# Patient Record
Sex: Male | Born: 1980
Health system: Southern US, Community
[De-identification: ages and names within clinical notes are randomized; demographics above are authoritative.]

## PROBLEM LIST (undated history)

## (undated) DIAGNOSIS — M48061 Spinal stenosis, lumbar region without neurogenic claudication: Secondary | ICD-10-CM

## (undated) DIAGNOSIS — R519 Headache, unspecified: Secondary | ICD-10-CM

## (undated) DIAGNOSIS — N201 Calculus of ureter: Secondary | ICD-10-CM

## (undated) HISTORY — PX: KNEE SURGERY: SHX244

## (undated) HISTORY — PX: FACIAL COSMETIC SURGERY: SHX629

## (undated) HISTORY — PX: FINGER FRACTURE SURGERY: SHX638

---

## 2016-07-11 ENCOUNTER — Emergency Department (HOSPITAL_BASED_OUTPATIENT_CLINIC_OR_DEPARTMENT_OTHER): Payer: Self-pay

## 2016-07-11 ENCOUNTER — Emergency Department (HOSPITAL_BASED_OUTPATIENT_CLINIC_OR_DEPARTMENT_OTHER)
Admission: EM | Admit: 2016-07-11 | Discharge: 2016-07-11 | Disposition: A | Discharge status: Home / Self Care | Payer: Self-pay | Attending: Emergency Medicine | Admitting: Emergency Medicine

## 2016-07-11 ENCOUNTER — Encounter (HOSPITAL_BASED_OUTPATIENT_CLINIC_OR_DEPARTMENT_OTHER): Payer: Self-pay | Admitting: *Deleted

## 2016-07-11 DIAGNOSIS — Y92009 Unspecified place in unspecified non-institutional (private) residence as the place of occurrence of the external cause: Secondary | ICD-10-CM | POA: Insufficient documentation

## 2016-07-11 DIAGNOSIS — X500XXA Overexertion from strenuous movement or load, initial encounter: Secondary | ICD-10-CM | POA: Insufficient documentation

## 2016-07-11 DIAGNOSIS — S43005A Unspecified dislocation of left shoulder joint, initial encounter: Secondary | ICD-10-CM | POA: Insufficient documentation

## 2016-07-11 DIAGNOSIS — Y999 Unspecified external cause status: Secondary | ICD-10-CM | POA: Insufficient documentation

## 2016-07-11 DIAGNOSIS — Y9389 Activity, other specified: Secondary | ICD-10-CM | POA: Insufficient documentation

## 2016-07-11 MED ORDER — IBUPROFEN 800 MG PO TABS
800.0000 mg | ORAL_TABLET | Freq: Once | ORAL | Status: AC
  Administered 2016-07-11: 800 mg via ORAL
  Filled 2016-07-11: qty 1

## 2016-07-11 MED ORDER — MORPHINE SULFATE (PF) 4 MG/ML IV SOLN
4.0000 mg | Freq: Once | INTRAVENOUS | Status: AC
  Administered 2016-07-11: 4 mg via INTRAMUSCULAR
  Filled 2016-07-11: qty 1

## 2016-07-11 MED ORDER — HYDROCODONE-ACETAMINOPHEN 5-325 MG PO TABS: 1.0000 | ORAL_TABLET | ORAL | 0 refills | Status: DC | PRN

## 2016-07-11 MED ORDER — ONDANSETRON 4 MG PO TBDP
4.0000 mg | ORAL_TABLET | Freq: Once | ORAL | Status: AC
  Administered 2016-07-11: 4 mg via ORAL
  Filled 2016-07-11: qty 1

## 2016-07-11 MED ORDER — DEXAMETHASONE SODIUM PHOSPHATE 10 MG/ML IJ SOLN
10.0000 mg | Freq: Once | INTRAMUSCULAR | Status: AC
  Administered 2016-07-11: 10 mg via INTRAMUSCULAR
  Filled 2016-07-11: qty 1

## 2016-07-11 MED ORDER — IBUPROFEN 800 MG PO TABS: 800.0000 mg | ORAL_TABLET | Freq: Three times a day (TID) | ORAL | 0 refills | Status: DC

## 2016-07-11 MED ORDER — HYDROCODONE-ACETAMINOPHEN 5-325 MG PO TABS
1.0000 | ORAL_TABLET | Freq: Once | ORAL | Status: AC
  Administered 2016-07-11: 1 via ORAL
  Filled 2016-07-11: qty 1

## 2016-07-11 NOTE — ED Triage Notes (Signed)
States he pick up a dresser ?300 lbs. C/o pain in left shoulder, neck and left hand is numb.

## 2016-07-11 NOTE — ED Provider Notes (Signed)
[facility] Provider Note   CSN: [id number] Arrival date & time: 07/11/16  0849     History   Chief Complaint Chief Complaint  Patient presents with  . Shoulder Injury    HPI Ricky Buckley is a 36 y.o. male.  Pt said that he picked up a dresser that weighed about 300 lbs and was carrying it out of a house.  He felt his shoulder "give out."  He said the lady that lived at the house was an Dance movement psychotherapistMS worker and manipulated his arm.  He felt a loud pop and his shoulder moved.  It sounds like it was dislocated, then reduced.  The pt still has pain in his left shoulder going down to his left elbow.  He has some numbness in his left hand.  He is able to move his left hand, but is unable to lift his left arm.      History reviewed. No pertinent past medical history.  There are no active problems to display for this patient.   History reviewed. No pertinent surgical history.     Home Medications    Prior to Admission medications   Medication Sig Start Date End Date Taking? Authorizing Provider  HYDROcodone-acetaminophen (NORCO/VICODIN) 5-325 MG tablet Take 1 tablet by mouth every 4 (four) hours as needed. 07/11/16   Jacalyn LefevreJulie Aboubacar Matsuo, MD  ibuprofen (ADVIL,MOTRIN) 800 MG tablet Take 1 tablet (800 mg total) by mouth 3 (three) times daily. 07/11/16   Jacalyn LefevreJulie Sharonne Ricketts, MD    Family History No family history on file.  Social History Social History  Substance Use Topics  . Smoking status: Never Smoker  . Smokeless tobacco: Never Used  . Alcohol use Not on file     Allergies   Penicillins   Review of Systems Review of Systems  Musculoskeletal:       Left shoulder pain  All other systems reviewed and are negative.    Physical Exam Updated Vital Signs BP 110/93 (BP Location: Right Arm)   Pulse 82   Temp 98.4 F (36.9 C) (Oral)   Resp 16   Ht 6\' 3"  (1.905 m)   Wt (!) 350 lb (158.8 kg)   SpO2 100%   BMI 43.75 kg/m   Physical Exam  Constitutional: He is  oriented to person, place, and time. He appears well-developed and well-nourished.  HENT:  Head: Normocephalic and atraumatic.  Right Ear: External ear normal.  Left Ear: External ear normal.  Nose: Nose normal.  Mouth/Throat: Oropharynx is clear and moist.  Eyes: Conjunctivae and EOM are normal. Pupils are equal, round, and reactive to light.  Neck: Normal range of motion. Neck supple.  Cardiovascular: Normal rate, regular rhythm, normal heart sounds and intact distal pulses.   Pulmonary/Chest: Effort normal and breath sounds normal.  Abdominal: Soft. Bowel sounds are normal.  Musculoskeletal:       Left shoulder: He exhibits decreased range of motion and tenderness.  Neurological: He is alert and oriented to person, place, and time.  Skin: Skin is warm.  Psychiatric: He has a normal mood and affect. His behavior is normal. Judgment and thought content normal.  Nursing note and vitals reviewed.    ED Treatments / Results  Labs (all labs ordered are listed, but only abnormal results are displayed) Labs Reviewed - No data to display  EKG  EKG Interpretation None       Radiology Dg Shoulder Left  Result Date: 07/11/2016 CLINICAL DATA:  Initial encounter for Picked up a  dresser approx 300 lbs today. C/o pain in LEFT shoulder, neck and left hand is numb. EXAM: LEFT SHOULDER - 2+ VIEW COMPARISON:  None. FINDINGS: Mild degenerative irregularity of the undersurface of the acromioclavicular joint. No acute fracture or dislocation. Visualized portion of the left hemithorax is normal. IMPRESSION: Degenerative change, without acute osseous finding. Electronically Signed   By: Jeronimo Greaves M.D.   On: 07/11/2016 09:44    Procedures Procedures (including critical care time)  Medications Ordered in ED Medications  HYDROcodone-acetaminophen (NORCO/VICODIN) 5-325 MG per tablet 1 tablet (not administered)  ibuprofen (ADVIL,MOTRIN) tablet 800 mg (not administered)  morphine 4 MG/ML  injection 4 mg (4 mg Intramuscular Given 07/11/16 0922)  dexamethasone (DECADRON) injection 10 mg (10 mg Intramuscular Given 07/11/16 0917)  ondansetron (ZOFRAN-ODT) disintegrating tablet 4 mg (4 mg Oral Given 07/11/16 0916)     Initial Impression / Assessment and Plan / ED Course  I have reviewed the triage vital signs and the nursing notes.  Pertinent labs & imaging results that were available during my care of the patient were reviewed by me and considered in my medical decision making (see chart for details).     Pt still has some pain, so will be given lortab and ibuprofen.  He is given a sling and the number for ortho to f/u.  He knows to return if worse.  Final Clinical Impressions(s) / ED Diagnoses   Final diagnoses:  Shoulder dislocation, left, initial encounter    New Prescriptions New Prescriptions   HYDROCODONE-ACETAMINOPHEN (NORCO/VICODIN) 5-325 MG TABLET    Take 1 tablet by mouth every 4 (four) hours as needed.   IBUPROFEN (ADVIL,MOTRIN) 800 MG TABLET    Take 1 tablet (800 mg total) by mouth 3 (three) times daily.     Jacalyn Lefevre, MD 07/11/16 [phone]

## 2016-07-15 ENCOUNTER — Encounter: Payer: Self-pay | Admitting: Family Medicine

## 2016-07-15 ENCOUNTER — Ambulatory Visit (INDEPENDENT_AMBULATORY_CARE_PROVIDER_SITE_OTHER): Payer: Self-pay | Admitting: Family Medicine

## 2016-07-15 DIAGNOSIS — S43015A Anterior dislocation of left humerus, initial encounter: Secondary | ICD-10-CM

## 2016-07-15 MED ORDER — OXYCODONE-ACETAMINOPHEN 7.5-325 MG PO TABS: 1.0000 | ORAL_TABLET | ORAL | 0 refills | Status: DC | PRN

## 2016-07-15 MED FILL — OXYCODONE-APAP 7.5-325 MG: 7.5-325 | 5 days supply | Qty: 30 | Fill #0

## 2016-07-15 NOTE — Patient Instructions (Addendum)
You dislocated your left shoulder. When tolerated do the home motion exercises (arm circles, swings, table slides) 3 sets of 10 twice a day. Ibuprofen 800mg  three times a day with food. Oxycodone as needed for severe pain. Get the [facility] coverage ASAP - this will help us take the next steps. Follow up with me in 1 1/2 weeks for reevaluation. Will consider physical therapy, MRI depending on how you're doing.

## 2016-07-16 DIAGNOSIS — S43015A Anterior dislocation of left humerus, initial encounter: Secondary | ICD-10-CM | POA: Insufficient documentation

## 2016-07-16 NOTE — Progress Notes (Signed)
PCP: No PCP Per Patient  Subjective:   HPI: Patient is a 36 y.o. male here for left shoulder injury.  Patient reports on 1/26 he was at work (first day on the job) lifting and carrying a 300 pound [employer]resser. While doing so he felt his left shoulder rip downwards and felt like his arm was 'just hanging there.' No prior injuries to this shoulder. A coworker reportedly had worked for EMS and relocated his shoulder. Pain level 9/10. He is having fairly severe sharp pain in this shoulder. Difficulty with any movement. Used a sling but too painful to use this. Is taking ibuprofen and liquid hydrocodone (makes him sleepy) from the ED. No skin changes, numbness, bruising.  No past medical history on file.  Current Outpatient Prescriptions on File Prior to Visit  Medication Sig Dispense Refill  . ibuprofen (ADVIL,MOTRIN) 800 MG tablet Take 1 tablet (800 mg total) by mouth 3 (three) times daily. 21 tablet 0   No current facility-administered medications on file prior to visit.     No past surgical history on file.  Allergies  Allergen Reactions  . Penicillins Shortness Of Breath    Social History   Social History  . Marital status: Single    Spouse name: N/A  . Number of children: N/A  . Years of education: N/A   Occupational History  . Not on file.   Social History Main Topics  . Smoking status: Never Smoker  . Smokeless tobacco: Never Used  . Alcohol use Not on file  . Drug use: Unknown  . Sexual activity: Not on file   Other Topics Concern  . Not on file   Social History Narrative  . No narrative on file    No family history on file.  BP (!) 139/93   Pulse (!) 54   Ht 6\' 3"  (1.905 m)   Wt (!) 354 lb (160.6 kg)   BMI 44.25 kg/m   Review of Systems: See HPI above.     Objective:  Physical Exam:  Gen: NAD, comfortable in exam room  Left shoulder: No swelling, ecchymoses.  No gross deformity.  Diffuse TTP anterior, posterior shoulder and scapula. Full  IR - only 20 degrees ER and 50 degrees flexion, all motions painful. Negative Yergasons. Did not test strength due to motion, pain limitations. NV intact distally.  Right shoulder: FROM Without pain.   Assessment & Plan:  1. Left shoulder dislocation - 5 days out from this, had relocated prior to visit to ED.  Independently reviewed radiographs and no evidence fracture, bony bankart lesion.  Feels worse in sling so will not use this.  Ibuprofen with oxycodone as needed for severe pain.  Shown codman exercises to help regain motion.  Get cone coverage so we can move forward with either PT, MRI depending on his improvement.  F/u in 1 1/2 weeks for reevaluation.

## 2016-07-16 NOTE — Assessment & Plan Note (Signed)
5 days out from this, had relocated prior to visit to ED.  Independently reviewed radiographs and no evidence fracture, bony bankart lesion.  Feels worse in sling so will not use this.  Ibuprofen with oxycodone as needed for severe pain.  Shown codman exercises to help regain motion.  Get cone coverage so we can move forward with either PT, MRI depending on his improvement.  F/u in 1 1/2 weeks for reevaluation.

## 2016-07-25 ENCOUNTER — Telehealth: Payer: Self-pay | Admitting: Family Medicine

## 2016-07-25 NOTE — Telephone Encounter (Signed)
Spoke to patient and he stated that he is working on the Delphicone coverage. Said that he has tried hydrocodone and it did not help.

## 2016-07-25 NOTE — Telephone Encounter (Signed)
Seeing him for an office visit is unlikely to help unless he reinjured himself.  Has he gotten the [facility] Coverage yet?  If he does we can go ahead with MRI as next step.  We could give him pain medicine as well though this is not going to fix anything (we could try hydrocodone instead to see if better results).

## 2016-07-25 NOTE — Telephone Encounter (Signed)
Made appointment for patient

## 2016-07-25 NOTE — Telephone Encounter (Signed)
Noted, thanks!

## 2016-07-26 ENCOUNTER — Encounter (HOSPITAL_BASED_OUTPATIENT_CLINIC_OR_DEPARTMENT_OTHER): Payer: Self-pay | Admitting: Emergency Medicine

## 2016-07-26 ENCOUNTER — Emergency Department (HOSPITAL_BASED_OUTPATIENT_CLINIC_OR_DEPARTMENT_OTHER): Payer: Self-pay

## 2016-07-26 ENCOUNTER — Emergency Department (HOSPITAL_BASED_OUTPATIENT_CLINIC_OR_DEPARTMENT_OTHER)
Admission: EM | Admit: 2016-07-26 | Discharge: 2016-07-26 | Disposition: A | Discharge status: Other Acute Inpatient Hospital | Payer: Self-pay | Attending: Urology | Admitting: Urology

## 2016-07-26 ENCOUNTER — Emergency Department (HOSPITAL_COMMUNITY): Payer: Self-pay | Admitting: Anesthesiology

## 2016-07-26 ENCOUNTER — Encounter (HOSPITAL_COMMUNITY)
Admission: EM | Disposition: A | Discharge status: Other Acute Inpatient Hospital | Payer: Self-pay | Source: Home / Self Care | Attending: Emergency Medicine

## 2016-07-26 DIAGNOSIS — Z87442 Personal history of urinary calculi: Secondary | ICD-10-CM | POA: Insufficient documentation

## 2016-07-26 DIAGNOSIS — N201 Calculus of ureter: Secondary | ICD-10-CM | POA: Insufficient documentation

## 2016-07-26 DIAGNOSIS — R52 Pain, unspecified: Secondary | ICD-10-CM

## 2016-07-26 HISTORY — DX: Calculus of ureter: N20.1

## 2016-07-26 HISTORY — PX: HOLMIUM LASER APPLICATION: SHX5852

## 2016-07-26 HISTORY — PX: CYSTOSCOPY/RETROGRADE/URETEROSCOPY/STONE EXTRACTION WITH BASKET: SHX5317

## 2016-07-26 SURGERY — CYSTOSCOPY, WITH CALCULUS REMOVAL USING BASKET
Anesthesia: General | Site: Ureter | Laterality: Left

## 2016-07-26 MED ORDER — FENTANYL CITRATE (PF) 100 MCG/2ML IJ SOLN
INTRAMUSCULAR | Status: AC
  Filled 2016-07-26: qty 2

## 2016-07-26 MED ORDER — SODIUM CHLORIDE 0.9 % IR SOLN
Status: DC | PRN
  Administered 2016-07-26: 1000 mL
  Administered 2016-07-26: 3000 mL
  Administered 2016-07-26: 1000 mL

## 2016-07-26 MED ORDER — KETOROLAC TROMETHAMINE 15 MG/ML IJ SOLN
15.0000 mg | Freq: Once | INTRAMUSCULAR | Status: AC
  Administered 2016-07-26: 15 mg via INTRAVENOUS
  Filled 2016-07-26: qty 1

## 2016-07-26 MED ORDER — HYDROMORPHONE HCL 1 MG/ML IJ SOLN
1.0000 mg | Freq: Once | INTRAMUSCULAR | Status: AC
  Administered 2016-07-26: 1 mg via INTRAVENOUS
  Filled 2016-07-26: qty 1

## 2016-07-26 MED ORDER — PROMETHAZINE HCL 25 MG/ML IJ SOLN: 6.2500 mg | INTRAMUSCULAR | Status: DC | PRN

## 2016-07-26 MED ORDER — CIPROFLOXACIN IN D5W 400 MG/200ML IV SOLN
400.0000 mg | Freq: Once | INTRAVENOUS | Status: AC
  Administered 2016-07-26: 400 mg via INTRAVENOUS
  Filled 2016-07-26: qty 200

## 2016-07-26 MED ORDER — ROCURONIUM BROMIDE 10 MG/ML (PF) SYRINGE
PREFILLED_SYRINGE | INTRAVENOUS | Status: DC | PRN
  Administered 2016-07-26: 30 mg via INTRAVENOUS

## 2016-07-26 MED ORDER — ALBUTEROL SULFATE HFA 108 (90 BASE) MCG/ACT IN AERS
INHALATION_SPRAY | RESPIRATORY_TRACT | Status: DC | PRN
Start: 2016-07-26 — End: 2016-07-26
  Administered 2016-07-26: 4 via RESPIRATORY_TRACT

## 2016-07-26 MED ORDER — PROPOFOL 10 MG/ML IV BOLUS
INTRAVENOUS | Status: AC
  Filled 2016-07-26: qty 20

## 2016-07-26 MED ORDER — HYDROCODONE-ACETAMINOPHEN 5-325 MG PO TABS
ORAL_TABLET | ORAL | Status: AC
  Filled 2016-07-26: qty 1

## 2016-07-26 MED ORDER — SODIUM CHLORIDE 0.9 % IV SOLN
Freq: Once | INTRAVENOUS | Status: AC
  Administered 2016-07-26: 04:00:00 via INTRAVENOUS

## 2016-07-26 MED ORDER — SULFAMETHOXAZOLE-TRIMETHOPRIM 800-160 MG PO TABS: 1.0000 | ORAL_TABLET | Freq: Two times a day (BID) | ORAL | 0 refills | Status: DC

## 2016-07-26 MED ORDER — PROPOFOL 10 MG/ML IV BOLUS
INTRAVENOUS | Status: DC | PRN
  Administered 2016-07-26: 200 mg via INTRAVENOUS
  Administered 2016-07-26: 50 mg via INTRAVENOUS

## 2016-07-26 MED ORDER — ONDANSETRON HCL 4 MG/2ML IJ SOLN
4.0000 mg | Freq: Once | INTRAMUSCULAR | Status: AC
  Administered 2016-07-26: 4 mg via INTRAVENOUS
  Filled 2016-07-26: qty 2

## 2016-07-26 MED ORDER — HYDROCODONE-ACETAMINOPHEN 5-325 MG PO TABS
1.0000 | ORAL_TABLET | Freq: Once | ORAL | Status: AC | PRN
  Administered 2016-07-26: 1 via ORAL

## 2016-07-26 MED ORDER — FENTANYL CITRATE (PF) 100 MCG/2ML IJ SOLN
25.0000 ug | INTRAMUSCULAR | Status: DC | PRN
Start: 2016-07-26 — End: 2016-07-26

## 2016-07-26 MED ORDER — SUCCINYLCHOLINE CHLORIDE 200 MG/10ML IV SOSY
PREFILLED_SYRINGE | INTRAVENOUS | Status: DC | PRN
  Administered 2016-07-26: 200 mg via INTRAVENOUS

## 2016-07-26 MED ORDER — SUGAMMADEX SODIUM 200 MG/2ML IV SOLN
INTRAVENOUS | Status: DC | PRN
  Administered 2016-07-26: 400 mg via INTRAVENOUS

## 2016-07-26 MED ORDER — PROMETHAZINE HCL 25 MG/ML IJ SOLN
INTRAMUSCULAR | Status: DC | PRN
  Administered 2016-07-26 (×2): 12.5 mg via INTRAVENOUS

## 2016-07-26 MED ORDER — DEXAMETHASONE SODIUM PHOSPHATE 10 MG/ML IJ SOLN
INTRAMUSCULAR | Status: DC | PRN
  Administered 2016-07-26: 10 mg via INTRAVENOUS

## 2016-07-26 MED ORDER — PROMETHAZINE HCL 25 MG/ML IJ SOLN
INTRAMUSCULAR | Status: AC
  Filled 2016-07-26: qty 1

## 2016-07-26 MED ORDER — LIDOCAINE 2% (20 MG/ML) 5 ML SYRINGE
INTRAMUSCULAR | Status: DC | PRN
  Administered 2016-07-26: 100 mg via INTRAVENOUS

## 2016-07-26 MED ORDER — OXYCODONE HCL 5 MG PO TABS: 5.0000 mg | ORAL_TABLET | ORAL | 0 refills | Status: DC | PRN

## 2016-07-26 MED ORDER — LACTATED RINGERS IV SOLN
INTRAVENOUS | Status: DC | PRN
  Administered 2016-07-26: 09:00:00 via INTRAVENOUS

## 2016-07-26 MED ORDER — OXYBUTYNIN CHLORIDE 5 MG PO TABS: 5.0000 mg | ORAL_TABLET | Freq: Three times a day (TID) | ORAL | 1 refills | Status: DC | PRN

## 2016-07-26 MED ORDER — 0.9 % SODIUM CHLORIDE (POUR BTL) OPTIME
TOPICAL | Status: DC | PRN
  Administered 2016-07-26: 1000 mL

## 2016-07-26 MED ORDER — CIPROFLOXACIN IN D5W 400 MG/200ML IV SOLN
INTRAVENOUS | Status: AC
  Filled 2016-07-26: qty 200

## 2016-07-26 MED ORDER — FENTANYL CITRATE (PF) 100 MCG/2ML IJ SOLN
INTRAMUSCULAR | Status: DC | PRN
  Administered 2016-07-26: 100 ug via INTRAVENOUS

## 2016-07-26 MED ORDER — SUGAMMADEX SODIUM 500 MG/5ML IV SOLN
INTRAVENOUS | Status: AC
  Filled 2016-07-26: qty 5

## 2016-07-26 MED ORDER — SODIUM CHLORIDE 0.9 % IV SOLN
INTRAVENOUS | Status: DC | PRN
  Administered 2016-07-26: 10 mL

## 2016-07-26 MED ORDER — MORPHINE SULFATE (PF) 4 MG/ML IV SOLN
4.0000 mg | Freq: Once | INTRAVENOUS | Status: AC
  Administered 2016-07-26: 4 mg via INTRAVENOUS
  Filled 2016-07-26: qty 1

## 2016-07-26 SURGICAL SUPPLY — 22 items
BAG URO CATCHER STRL LF (MISCELLANEOUS) ×3 IMPLANT
BASKET DAKOTA 1.9FR 11X120 (BASKET) ×6 IMPLANT
BASKET LASER NITINOL 1.9FR (BASKET) IMPLANT
BASKET ZERO TIP NITINOL 2.4FR (BASKET) IMPLANT
CATH INTERMIT  6FR 70CM (CATHETERS) ×3 IMPLANT
CLOTH BEACON ORANGE TIMEOUT ST (SAFETY) ×3 IMPLANT
FIBER LASER FLEXIVA 365 (UROLOGICAL SUPPLIES) IMPLANT
FIBER LASER TRAC TIP (UROLOGICAL SUPPLIES) ×3 IMPLANT
GLOVE BIOGEL M 8.0 STRL (GLOVE) ×3 IMPLANT
GOWN STRL REUS W/ TWL XL LVL3 (GOWN DISPOSABLE) ×1 IMPLANT
GOWN STRL REUS W/TWL LRG LVL3 (GOWN DISPOSABLE) ×6 IMPLANT
GOWN STRL REUS W/TWL XL LVL3 (GOWN DISPOSABLE) ×2
GUIDEWIRE ANG ZIPWIRE 038X150 (WIRE) ×3 IMPLANT
GUIDEWIRE STR DUAL SENSOR (WIRE) ×3 IMPLANT
IV NS 1000ML (IV SOLUTION) ×4
IV NS 1000ML BAXH (IV SOLUTION) ×2 IMPLANT
MANIFOLD NEPTUNE II (INSTRUMENTS) ×3 IMPLANT
PACK CYSTO (CUSTOM PROCEDURE TRAY) ×3 IMPLANT
SHEATH URET ACCESS 12FR/35CM (UROLOGICAL SUPPLIES) ×3 IMPLANT
STENT URET 6FRX26 CONTOUR (STENTS) ×3 IMPLANT
TUBING CONNECTING 10 (TUBING) ×2 IMPLANT
TUBING CONNECTING 10' (TUBING) ×1

## 2016-07-26 NOTE — Anesthesia Procedure Notes (Signed)
Procedure Name: Intubation Date/Time: 07/26/2016 9:44 AM Performed by: Leroy LibmanEARDON, Gabreil [place] L Patient Re-evaluated:Patient Re-evaluated prior to inductionOxygen Delivery Method: Circle system utilized Preoxygenation: Pre-oxygenation with 100% oxygen Intubation Type: IV induction and Rapid sequence Laryngoscope Size: Miller and 3 Grade View: Grade I Tube type: Oral Tube size: 8.0 mm Number of attempts: 1 Airway Equipment and Method: Stylet Placement Confirmation: ETT inserted through vocal cords under direct vision,  positive ETCO2 and breath sounds checked- equal and bilateral Secured at: 22 cm Tube secured with: Tape Dental Injury: Teeth and Oropharynx as per pre-operative assessment

## 2016-07-26 NOTE — ED Notes (Signed)
Out with [facility], no changes, family present, calmer, NAD, interactive.

## 2016-07-26 NOTE — ED Notes (Signed)
Pt alert, NAD, calm, interactive, resps e/u, no dyspnea noted, c/o L flank pain, 10/10, recent vomiting, meds given, [facility], to [place] at this time.

## 2016-07-26 NOTE — ED Notes (Signed)
EDP into room 

## 2016-07-26 NOTE — ED Notes (Signed)
EDP at [facility] 

## 2016-07-26 NOTE — Op Note (Signed)
Preoperative diagnosis: Left ureteral and renal calculi.  Postoperative diagnosis: Same  Principal procedure: Cystoscopy, left retrograde ureteropyelogram with fluoroscopic interpretation, left ureteroscopy-rigid/flexible-holmium laser and extraction of all to pull ureteral and renal calculi, placement of 6 French by 26 centimeter contour double-J stent with string.  Surgeon: Retta Diones  Anesthesia: Gen. with LMA.  Complications: None  Drains: Above-mentioned stent.  Estimated blood loss: Less than 25 mL  Specimen: Stone fragments, to the patient.  Indications: 36 year old male with recurrent urolithiasis, presenting to the emergency room at [facility] late last night/early this morning with significant left flank pain.  He was found to have 2 small left proximal ureteral calculi.  Unfortunately, despite significant IV narcotics and anti-inflammatories, he was unable to be made comfortable.  He had persistent nausea, vomiting and pain.  He was transferred here for eventual management of his stone burden.  I have discussed the procedure of ureteroscopy and extraction of stone with the patient.  He desires to proceed.  Description of procedure: The patient was properly identified and marked in the holding area.  He was then taken to the operating room where general anesthesia was administered with the endotracheal tube.  He was then placed in the dorsolithotomy position.  Genitalia and perineum were prepped and draped.  Proper timeout was performed.  A 21 French panendoscope was advanced easily through his urethra which was free of lesions.  The prostate was nonobstructive.  The bladder was entered and inspected circumferentially.  There are no tumors, trabeculations or foreign bodies.  Ureteral orifices were normal in configuration and location.  The left ureter was cannulated with the open-ended catheter.  Omnipaque was used to perform a retrograde ureteropyelogram.  This revealed a  filling defect in the proximal ureter with minimal/moderate pyelocaliectasis proximal to this.  I did not see any specific filling defects in the pyelo-calyceal system.  I then negotiated a 0.038 inch sensor-tip guidewire through the open-ended catheter.  This was easily passed up the ureter and curled in the upper pole calyceal system.  The open-ended catheter and the cystoscope were then removed.  I dilated the distal/mid ureter first with the inner core of the 12/14 ureteral access catheter.  This dilatation was quite easy.  I then removed the inner core.  I then passed the rigid ureteroscope through the urethra, into the bladder.  Note the ureter.  On the one stone was seen.  This was grasped quite easily with the [place] basket and brought into the bladder.  The scope was then passed as far proximal as possible.  I did not see a stone in the ureter for his far as that scope could be advanced.  The rigid ureteroscope was then removed.  Over top of the guidewire, I passed the entire 12/14 ureteral access catheter.  The inner core and guidewire were removed.  I then passed the flexible/digital, 6 French ureteroscope.  This easily passed up into the UPJ region.  The stone was right at the UPJ.  This was bumped into the renal pelvis.  The scope and easily passed in the renal pelvis.  I then inspected all calyces, sequentially from upper pole calyces, through the interpolar calyces and into the lower pole calyces.  2.  3 small calyceal stones were identified and then removed with the [place] basket.  2-3 larger stones were then fragmented with the 200 micron laser fiber.  These were fragmented into smaller fragments which were then, with somewhat difficulty, extracted with the [place] basket.  Careful inspection of all calyces.  Multiple times, revealed that all significant fragments were extracted.  At this point, when I thought the patient was stone free, the scope was removed.  I then repassed the dura 0.038 inch  sensor-tip guidewire up the ureter, with a curl seen in the upper pole calyceal system.  The ureteral access sheath was then removed.  I then backloaded the guidewire through the cystoscope, and using direct vision as well as fluoroscopic guidance, a 6 [place] by 26 centimeter contour double-J stent was deployed in the left ureter.  Excellent proximal distal curls were seen using fluoroscopy and cystoscopy.  The string was left on.  The bladder was then drained.  The scope was removed.  The string was taped to the patient's penis.  He was then awakened and taken to the past you in stable condition.  He tolerated the procedure well.

## 2016-07-26 NOTE — Consult Note (Signed)
Urology Consult  Consulting ZO:XWRUEAD:[place]  CC: Kidney stone  HPI: This is a 36 year old male with prior h/[place] kidney [place], [place]) w/ sudden onset lt flank 0pain last pm. Seen @ [facility] and transferred here for further mgmt as pt could not be made comfortable w/ aggressive IV narcotics. He is admitted for surgical mgmt of 2 small lt midureteral stones.  PMH: Past Medical History:  Diagnosis Date  . Ureterolithiasis     PSH: History reviewed. No pertinent surgical history.  Allergies: Allergies  Allergen Reactions  . Penicillins Shortness Of Breath    Medications:  (Not in a hospital admission)   Social History: Social History   Social History  . Marital status: Single    Spouse name: N/A  . Number of children: N/A  . Years of education: N/A   Occupational History  . Not on file.   Social History Main Topics  . Smoking status: Never Smoker  . Smokeless tobacco: Never Used  . Alcohol use No  . Drug use: No  . Sexual activity: Not on file   Other Topics Concern  . Not on file   Social History Narrative  . No narrative on file    Family History: History reviewed. No pertinent family history.  Review of Systems: Positive: Lt flank pain, N/V Negative: .  A further 10 point review of systems was negative except what is listed in the HPI.  Physical Exam: @VITALS2 @ General: No acute distress.  Awake. Head:  Normocephalic.  Atraumatic. ENT:  EOMI.  Mucous membranes moist Neck:  Supple.  No lymphadenopathy. CV:  S1 present. S2 present. Regular rate. Pulmonary: Equal effort bilaterally.  Clear to auscultation bilaterally. Abdomen: Soft.  Lt CVA and LLQ tender to palpation. Skin:  Normal turgor.  No visible rash. Extremity: No gross deformity of bilateral upper extremities.  No gross deformity of    bilateral lower extremities. Neurologic: Alert. Appropriate mood. .  Studies:  No results for input(s): HGB, WBC, PLT in the last 72  hours.  No results for input(s): NA, K, CL, CO2, BUN, CREATININE, CALCIUM, GFRNONAA, GFRAA in the last 72 hours.  Invalid input(s): MAGNESIUM   No results for input(s): INR, APTT in the last 72 hours.  Invalid input(s): PT   Invalid input(s): ABG  I have reviewed pt's lab?CT findings  Assessment:  LT ureteral/renal calculi  Plan: Cysto, Lt RGP, LT URS w/ possible holmium laser/extraction of stones, Lt J2 stent    Pager:[phone]

## 2016-07-26 NOTE — Transfer of Care (Signed)
Immediate Anesthesia Transfer of Care Note  Patient: Ricky Buckley  Procedure(s) Performed: Procedure(s): CYSTOSCOPY/LEFT RETROGRADE/LEFT URETEROSCOPY/STONE EXTRACTION WITH BASKET, LEFT URETERAL STENT PLACEMENT (Left) HOLMIUM LASER APPLICATION (Left)  Patient Location: PACU  Anesthesia Type:General  Level of Consciousness: awake  Airway & Oxygen Therapy: Patient Spontanous Breathing and Patient connected to face mask oxygen  Post-op Assessment: Report given to RN and Post -op Vital signs reviewed and stable  Post vital signs: Reviewed and stable  Last Vitals:  Vitals:   07/26/16 0545 07/26/16 0853  BP: 126/91 140/94  Pulse: 62 77  Resp:  18  Temp:  36.6 C    Last Pain:  Vitals:   07/26/16 0855  TempSrc:   PainSc: 5          Complications: No apparent anesthesia complications

## 2016-07-26 NOTE — Anesthesia Preprocedure Evaluation (Addendum)
Anesthesia Evaluation  Patient identified by MRN, date of birth, ID band Patient awake    Reviewed: Allergy & Precautions, NPO status , Patient's Chart, lab work & pertinent test results  Airway Mallampati: III  TM Distance: >3 FB Neck ROM: Full    Dental  (+) Dental Advisory Given   Pulmonary neg pulmonary ROS,    breath sounds clear to auscultation       Cardiovascular negative cardio ROS   Rhythm:Regular Rate:Normal     Neuro/Psych negative neurological ROS     GI/Hepatic negative GI ROS, Neg liver ROS,   Endo/Other  Morbid obesity  Renal/GU negative Renal ROS     Musculoskeletal   Abdominal   Peds  Hematology negative hematology ROS (+)   Anesthesia Other Findings   Reproductive/Obstetrics                           No results found for: WBC, HGB, HCT, MCV, PLT No results found for: CREATININE, BUN, NA, K, CL, CO2  Anesthesia Physical Anesthesia Plan  ASA: III  Anesthesia Plan: General   Post-op Pain Management:    Induction: Intravenous  Airway Management Planned: Oral ETT  Additional Equipment:   Intra-op Plan:   Post-operative Plan: Extubation in OR  Informed Consent: I have reviewed the patients History and Physical, chart, labs and discussed the procedure including the risks, benefits and alternatives for the proposed anesthesia with the patient or authorized representative who has indicated his/her understanding and acceptance.   Dental advisory given  Plan Discussed with: CRNA  Anesthesia Plan Comments:        Anesthesia Quick Evaluation

## 2016-07-26 NOTE — Anesthesia Postprocedure Evaluation (Signed)
Anesthesia Post Note  Patient: Andi DevonMichael Oriley  Procedure(s) Performed: Procedure(s) (LRB): CYSTOSCOPY/LEFT RETROGRADE/LEFT URETEROSCOPY/STONE EXTRACTION WITH BASKET, LEFT URETERAL STENT PLACEMENT (Left) HOLMIUM LASER APPLICATION (Left)  Patient location during evaluation: PACU Anesthesia Type: General Level of consciousness: awake and alert Pain management: pain level controlled Vital Signs Assessment: post-procedure vital signs reviewed and stable Respiratory status: spontaneous breathing, nonlabored ventilation, respiratory function stable and patient connected to nasal cannula oxygen Cardiovascular status: blood pressure returned to baseline and stable Postop Assessment: no signs of nausea or vomiting Anesthetic complications: no       Last Vitals:  Vitals:   07/26/16 1130 07/26/16 1145  BP: (!) 165/95 (!) 158/94  Pulse: 83 69  Resp: 16 17  Temp: 37.1 C     Last Pain:  Vitals:   07/26/16 1145  TempSrc:   PainSc: 0-No pain                 Kennieth RadFitzgerald, Sheralee Qazi E

## 2016-07-26 NOTE — Discharge Instructions (Signed)
1. You may see some blood in the urine and may have some burning with urination for 48-72 hours. You also may notice that you have to urinate more frequently or urgently after your procedure which is normal.  2. You should call should you develop an inability urinate, fever > 101, persistent nausea and vomiting that prevents you from eating or drinking to stay hydrated.  3. If you have a stent, you will likely urinate more frequently and urgently until the stent is removed and you may experience some discomfort/pain in the lower abdomen and flank especially when urinating. You may take pain medication prescribed to you if needed for pain. You may also intermittently have blood in the urine until the stent is removed.  It is okay to pull the string to remove the stent on Monday morning. 4. If you have a catheter, you will be taught how to take care of the catheter by the nursing staff prior to discharge from the hospital.  You may periodically feel a strong urge to void with the catheter in place.  This is a bladder spasm and most often can occur when having a bowel movement or moving around. It is typically self-limited and usually will stop after a few minutes.  You may use some Vaseline or Neosporin around the tip of the catheter to reduce friction at the tip of the penis. You may also see some blood in the urine.  A very small amount of blood can make the urine look quite red.  As long as the catheter is draining well, there usually is not a problem.  However, if the catheter is not draining well and is bloody, you should call the office ([phone]) to notify us. 

## 2016-07-26 NOTE — Progress Notes (Signed)
Discharged to Private car via wheelchair, Voided large amount of blood tinged urine prior to dicharge.

## 2016-07-26 NOTE — Progress Notes (Signed)
Sat up and ate saltine crackers and cup of ginger ale with PO medication for pain, Appears to have tolerated well

## 2016-07-26 NOTE — ED Provider Notes (Signed)
[facility] Provider Note: Ricky Dell, MD, FACEP  CSN: [id number] MRN: [id number] ARRIVAL: 07/26/16 at 0258 ROOM: MH06/[facility]06   CHIEF COMPLAINT  Flank Pain   HISTORY OF PRESENT ILLNESS  Ricky Buckley is a 36 y.o. male with a history of ureterolithiasis. He is here with a 2 hour history of left flank pain. The pain began about 2 and half hours ago. The onset was acute. The pain is similar to previous kidney stones but worse. He rates the pain as severe. It is worse with movement or palpation of the left flank. There is been associated nausea and bilious vomiting. There is associated diaphoresis. He has not been able to urinate since the onset of the pain. Had a recent shoulder injury which is causing him pain on movement of his left shoulder but this pain is much less severe than his flank pain.  Consultation with the [facility] state controlled substances database reveals the patient has received 3 narcotic prescriptions in the past year, all of them last month related to his recent shoulder injury..    Past Medical History:  Diagnosis Date  . Ureterolithiasis     History reviewed. No pertinent surgical history.  History reviewed. No pertinent family history.  Social History  Substance Use Topics  . Smoking status: Never Smoker  . Smokeless tobacco: Never Used  . Alcohol use No    Prior to Admission medications   Medication Sig Start Date End Date Taking? Authorizing Provider  ibuprofen (ADVIL,MOTRIN) 800 MG tablet Take 1 tablet (800 mg total) by mouth 3 (three) times daily. 07/11/16   Jacalyn Lefevre, MD  oxyCODONE-acetaminophen (PERCOCET) 7.5-325 MG tablet Take 1 tablet by mouth every 4 (four) hours as needed for severe pain. 07/15/16   Lenda Kelp, MD    Allergies Penicillins   REVIEW OF SYSTEMS  Negative except as noted here or in the History of Present Illness.   PHYSICAL EXAMINATION  Initial Vital Signs Blood pressure (!) 162/124, pulse 101,  temperature 98.2 F (36.8 C), temperature source Oral, resp. rate 26, height 6\' 3"  (1.905 m), weight (!) 354 lb (160.6 kg), SpO2 100 %.  Examination General: Well-developed, obese male in obvious discomfort; appearance consistent with age of record HENT: normocephalic; atraumatic Eyes: pupils equal, round and reactive to light; extraocular muscles intact Neck: supple Heart: regular rate and rhythm Lungs: clear to auscultation bilaterally Abdomen: soft; obese; mild left lower quadrant tenderness; bowel sounds present GU: Left CVA tenderness Extremities: No deformity; full range of motion; pulses normal Neurologic: Awake, alert and oriented; motor function intact in all extremities and symmetric; no facial droop Skin: Clammy Psychiatric: Agitated   RESULTS  Summary of this visit's results, reviewed by myself:   EKG Interpretation  Date/Time:    Ventricular Rate:    PR Interval:    QRS Duration:   QT Interval:    QTC Calculation:   R Axis:     Text Interpretation:        Laboratory Studies: No results found for this or any previous visit (from the past 24 hour(s)). Imaging Studies: Ct Renal Stone Study  Result Date: 07/26/2016 CLINICAL DATA:  36 y/o M; left flank pain. History of kidney stones. EXAM: CT ABDOMEN AND PELVIS WITHOUT CONTRAST TECHNIQUE: Multidetector CT imaging of the abdomen and pelvis was performed following the standard protocol without IV contrast. COMPARISON:  None. FINDINGS: Lower chest: No acute abnormality. Hepatobiliary: No focal liver abnormality is seen. No gallstones, gallbladder wall thickening, or  biliary dilatation. Pancreas: Unremarkable. No pancreatic ductal dilatation or surrounding inflammatory changes. Spleen: Normal in size without focal abnormality. Adrenals/Urinary Tract: Mild left-sided pelviectasis secondary to a 3 mm stone at the ureteropelvic junction and a second 2 mm stone slightly downstream within the proximal ureter. There is mild fat  stranding surrounding the left renal pelvis. There are multiple punctate nonobstructing stones throughout the kidneys bilaterally. No right-sided hydronephrosis. Normal bladder. Normal adrenal glands. Stomach/Bowel: Stomach is within normal limits. Appendix appears normal. No evidence of bowel wall thickening, distention, or inflammatory changes. Vascular/Lymphatic: No significant vascular findings are present. No enlarged abdominal or pelvic lymph nodes. Reproductive: Prostate is unremarkable. Other: No abdominal wall hernia or abnormality. No abdominopelvic ascites. 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 IMPRESSION: Mild left-sided pelviectasis secondary to a 3 mm stone at the ureteropelvic junction and a second 2 mm stone slightly downstream within the proximal ureter. Multiple bilateral punctate nonobstructing kidney stones. Electronically Signed   By: Mitzi HansenLance  Furusawa-Stratton M.D.   On: 07/26/2016 04:14    ED COURSE  Nursing notes and initial vitals signs, including pulse oximetry, reviewed.  Vitals:   07/26/16 0304 07/26/16 0306 07/26/16 0500 07/26/16 0515  BP:  (!) 162/124 140/98 133/94  Pulse:  101 76 (!) 54  Resp:  26    Temp:  98.2 F (36.8 C)    TempSrc:  Oral    SpO2:  100% 94% 100%  Weight: (!) 354 lb (160.6 kg)     Height: 6\' 3"  (1.905 m)      5:21 AM Pain poorly controlled despite multiple analgesics.   5:29 AM Dr[facility] Ar[facility]ahlstet [facility]  PROCEDURES    ED DIAGNOSES     ICD-9-CM ICD-10-CM   1. Ureterolithiasis 592.1 N20.1   2. Intractable pain 780.96 R52        Ricky LibraJohn Liat Mayol, MD 07/26/16 0530

## 2016-07-26 NOTE — ED Triage Notes (Signed)
Patient reports that he has had pain to his left flank x 2 hours. Hx of kidney stones

## 2016-07-28 ENCOUNTER — Encounter (HOSPITAL_COMMUNITY): Payer: Self-pay | Admitting: Urology

## 2016-07-28 ENCOUNTER — Ambulatory Visit: Payer: Self-pay | Admitting: Family Medicine

## 2016-07-28 LAB — GLUCOSE, CAPILLARY: GLUCOSE-CAPILLARY: 102 mg/dL — AB (ref 65–99)

## 2016-08-01 ENCOUNTER — Telehealth: Payer: Self-pay | Admitting: Family Medicine

## 2016-08-01 NOTE — Telephone Encounter (Signed)
Spoke to patient and told him she would be back in the office on Monday.

## 2016-08-04 ENCOUNTER — Ambulatory Visit: Payer: Self-pay | Admitting: Family Medicine

## 2016-12-01 ENCOUNTER — Encounter (HOSPITAL_BASED_OUTPATIENT_CLINIC_OR_DEPARTMENT_OTHER): Payer: Self-pay | Admitting: *Deleted

## 2016-12-01 ENCOUNTER — Emergency Department (HOSPITAL_BASED_OUTPATIENT_CLINIC_OR_DEPARTMENT_OTHER)
Admission: EM | Admit: 2016-12-01 | Discharge: 2016-12-01 | Disposition: A | Discharge status: Home / Self Care | Payer: Self-pay | Attending: Emergency Medicine | Admitting: Emergency Medicine

## 2016-12-01 DIAGNOSIS — H6591 Unspecified nonsuppurative otitis media, right ear: Secondary | ICD-10-CM

## 2016-12-01 DIAGNOSIS — H6641 Suppurative otitis media, unspecified, right ear: Secondary | ICD-10-CM | POA: Insufficient documentation

## 2016-12-01 DIAGNOSIS — H6123 Impacted cerumen, bilateral: Secondary | ICD-10-CM | POA: Insufficient documentation

## 2016-12-01 MED ORDER — AZITHROMYCIN 250 MG PO TABS
500.0000 mg | ORAL_TABLET | Freq: Once | ORAL | Status: AC
  Administered 2016-12-01: 500 mg via ORAL
  Filled 2016-12-01: qty 2

## 2016-12-01 MED ORDER — MECLIZINE HCL 25 MG PO TABS: 25.0000 mg | ORAL_TABLET | Freq: Three times a day (TID) | ORAL | 0 refills | Status: DC | PRN

## 2016-12-01 MED ORDER — IBUPROFEN 800 MG PO TABS
800.0000 mg | ORAL_TABLET | Freq: Once | ORAL | Status: AC
  Administered 2016-12-01: 800 mg via ORAL
  Filled 2016-12-01: qty 1

## 2016-12-01 MED ORDER — AZITHROMYCIN 250 MG PO TABS: 250.0000 mg | ORAL_TABLET | Freq: Every day | ORAL | 0 refills | Status: DC

## 2016-12-01 MED ORDER — DOCUSATE SODIUM 50 MG/5ML PO LIQD
50.0000 mg | Freq: Once | ORAL | Status: AC
  Administered 2016-12-01: 50 mg via OTIC
  Filled 2016-12-01: qty 10

## 2016-12-01 MED ORDER — ONDANSETRON 4 MG PO TBDP
4.0000 mg | ORAL_TABLET | Freq: Once | ORAL | Status: AC
  Administered 2016-12-01: 4 mg via ORAL
  Filled 2016-12-01: qty 1

## 2016-12-01 MED ORDER — MECLIZINE HCL 25 MG PO TABS
25.0000 mg | ORAL_TABLET | Freq: Once | ORAL | Status: AC
  Administered 2016-12-01: 25 mg via ORAL
  Filled 2016-12-01: qty 1

## 2016-12-01 MED ORDER — ONDANSETRON 4 MG PO TBDP: 4.0000 mg | ORAL_TABLET | Freq: Four times a day (QID) | ORAL | 0 refills | Status: DC | PRN

## 2016-12-01 NOTE — ED Notes (Signed)
Pt discharged to home NAD.  

## 2016-12-01 NOTE — Discharge Instructions (Signed)
You may alternate Tylenol 1000 mg every 6 hours as needed for pain and Ibuprofen 800 mg every 8 hours as needed for pain.  Please take Ibuprofen with food. ° ° ° °To find a primary care or specialty doctor please call [phone] or [phone] to access "[place] [facility] Find a Doctor Service." ° °You may also go on the [facility] website at [email or web address] ° °There are also multiple [facility] practices throughout the [place] that are frequently accepting new patients. You may find a clinic that is close to your home and contact them. ° ° and Wellness -  °[address] °[place] [address] °[phone] ° ° °[facility] -  °[address] °[place] [address] °[phone] ° ° °[facility] - °[address]  °[place] [address] °[phone] ° ° °

## 2016-12-01 NOTE — ED Provider Notes (Signed)
TIME SEEN: 2:17 AM  CHIEF COMPLAINT: Right ear pain  HPI: Patient is a 36 year old male with history of kidney stones who presents the emergency department with right ear pain that started at 1 AM. States he often has a problem with increased earwax and has had cerumen impactions in the past. States he was told in the past by an ENT physician to not use Q-tips anymore and to use warm water and hydrogen peroxide. States he tried this at home and felt like his ear pain only got worse. Describes it as a throbbing severe pain. States he was not able to get any earwax out. States now he feels like his equilibrium is off and he feels nauseated. No fevers, sore throat, dental pain, facial swelling.    ROS: See HPI Constitutional: no fever  Eyes: no drainage  ENT: no runny nose   Cardiovascular:  no chest pain  Resp: no SOB  GI: no vomiting GU: no dysuria Integumentary: no rash  Allergy: no hives  Musculoskeletal: no leg swelling  Neurological: no slurred speech ROS otherwise negative  PAST MEDICAL HISTORY/PAST SURGICAL HISTORY:  Past Medical History:  Diagnosis Date  . Ureterolithiasis     MEDICATIONS:  Prior to Admission medications   Medication Sig Start Date End Date Taking? Authorizing Provider  ibuprofen (ADVIL,MOTRIN) 800 MG tablet Take 1 tablet (800 mg total) by mouth 3 (three) times daily. 07/11/16   Jacalyn Lefevre, MD  oxybutynin (DITROPAN) 5 MG tablet Take 1 tablet (5 mg total) by mouth every 8 (eight) hours as needed for bladder spasms. 07/26/16   Marcine Matar, MD  oxyCODONE (ROXICODONE) 5 MG immediate release tablet Take 1 tablet (5 mg total) by mouth every 4 (four) hours as needed for severe pain. 07/26/16   Marcine Matar, MD  oxyCODONE-acetaminophen (PERCOCET) 7.5-325 MG tablet Take 1 tablet by mouth every 4 (four) hours as needed for severe pain. 07/15/16   Hudnall, Azucena Fallen, MD  sulfamethoxazole-trimethoprim (BACTRIM DS,SEPTRA DS) 800-160 MG tablet Take 1 tablet by  mouth 2 (two) times daily. 07/26/16   Marcine Matar, MD    ALLERGIES:  Allergies  Allergen Reactions  . Penicillins Shortness Of Breath    SOCIAL HISTORY:  Social History  Substance Use Topics  . Smoking status: Never Smoker  . Smokeless tobacco: Never Used  . Alcohol use No    FAMILY HISTORY: No family history on file.  EXAM: BP 119/82 (BP Location: Left Arm)   Pulse 72   Temp 98.5 F (36.9 C) (Oral)   Resp 18   Ht 6\' 3"  (1.905 m)   Wt (!) 156.5 kg (345 lb)   SpO2 99%   BMI 43.12 kg/m  CONSTITUTIONAL: Alert and oriented and responds appropriately to questions. Well-appearing; well-nourished HEAD: Normocephalic EYES: Conjunctivae clear, pupils appear equal, EOMI ENT: normal nose; moist mucous membranes; No pharyngeal erythema or petechiae, no tonsillar hypertrophy or exudate, no uvular deviation, no unilateral swelling, no trismus or drooling, no muffled voice, normal phonation, no stridor, no dental caries present, no drainable dental abscess noted, no Ludwig's angina, tongue sits flat in the bottom of the mouth, no angioedema, no facial erythema or warmth, no facial swelling; no pain with movement of the neck.  Initially patient has bilateral cerumen impaction Unable to visualize his TMs in all. After irrigation, TMs are Erythematous bilaterally with bulging and purulence noted to the right TM without perforation. Patient does have bilateral effusion noted.  No sign of foreign body in the external auditory canal.  No inflammation, erythema or drainage from the external auditory canal. No signs of mastoiditis. No pain with manipulation of the pinna bilaterally. NECK: Supple, no meningismus, no nuchal rigidity, no LAD  CARD: RRR; S1 and S2 appreciated; no murmurs, no clicks, no rubs, no gallops RESP: Normal chest excursion without splinting or tachypnea; breath sounds clear and equal bilaterally; no wheezes, no rhonchi, no rales, no hypoxia or respiratory distress, speaking  full sentences ABD/GI: Normal bowel sounds; non-distended; soft, non-tender, no rebound, no guarding, no peritoneal signs, no hepatosplenomegaly BACK:  The back appears normal and is non-tender to palpation, there is no CVA tenderness EXT: Normal ROM in all joints; non-tender to palpation; no edema; normal capillary refill; no cyanosis, no calf tenderness or swelling    SKIN: Normal color for age and race; warm; no rash NEURO: Moves all extremities equally, normal gait, normal speech, no nystagmus PSYCH: The patient's mood and manner are appropriate. Grooming and personal hygiene are appropriate.  MEDICAL DECISION MAKING: Patient here with bilateral cerumen impaction. Unable to visualize his tympanic membrane at all. Likely the cause of his symptoms and vertigo. Will give ibuprofen, meclizine, Zofran. Will put liquid Colace in both ears and then irrigate.  ED PROGRESS: We have successfully irrigated with his ears and now his external auditory canals are completely patent. He does appear to have bilateral otitis media, worse on the right side. No TM perforation. Will discharge on azithromycin as he reports he has an anaphylactic reaction to penicillins is not sure if he can take cephalosporins. Have recommended alternating Tylenol and Motrin for pain. Recommended meclizine, Zofran as needed for vertiginous symptoms. He is able to a year without difficulty.  At this time, I do not feel there is any life-threatening condition present. I have reviewed and discussed all results (EKG, imaging, lab, urine as appropriate) and exam findings with patient/family. I have reviewed nursing notes and appropriate previous records.  I feel the patient is safe to be discharged home without further emergent workup and can continue workup as an outpatient as needed. Discussed usual and customary return precautions. Patient/family verbalize understanding and are comfortable with this plan.  Outpatient follow-up has been  provided if needed. All questions have been answered.   Procedures .Ear Cerumen Removal Date/Time: 12/01/2016 2:30 AM Performed by: Patrecia Pacerystal Adkins RN Authorized by: Raelyn NumberWARD, Alvaretta Eisenberger N   Consent:    Consent obtained:  Verbal   Consent given by:  Patient   Risks discussed:  Bleeding, infection, pain, TM perforation, incomplete removal and dizziness   Alternatives discussed:  No treatment, delayed treatment and alternative treatment Procedure details:    Location:  L ear and R ear   Procedure type: irrigation   Post-procedure details:    Inspection:  TM intact   Hearing quality:  Normal   Patient tolerance of procedure:  Tolerated well, no immediate complications     Norva Bowe, Layla MawKristen N, DO 12/01/16 0304

## 2016-12-01 NOTE — ED Triage Notes (Signed)
C/o of right ear pain that started around 1am. States he attempted to flush his right ear and now he feels dizzy and increase in pain to right ear.  States recent cough which he states is allergies, but denies any other complaints.

## 2016-12-23 ENCOUNTER — Emergency Department (HOSPITAL_BASED_OUTPATIENT_CLINIC_OR_DEPARTMENT_OTHER): Payer: Self-pay

## 2016-12-23 ENCOUNTER — Encounter (HOSPITAL_BASED_OUTPATIENT_CLINIC_OR_DEPARTMENT_OTHER): Payer: Self-pay | Admitting: *Deleted

## 2016-12-23 ENCOUNTER — Emergency Department (HOSPITAL_BASED_OUTPATIENT_CLINIC_OR_DEPARTMENT_OTHER)
Admission: EM | Admit: 2016-12-23 | Discharge: 2016-12-23 | Disposition: A | Discharge status: Home / Self Care | Payer: Self-pay | Attending: Emergency Medicine | Admitting: Emergency Medicine

## 2016-12-23 DIAGNOSIS — S62525A Nondisplaced fracture of distal phalanx of left thumb, initial encounter for closed fracture: Secondary | ICD-10-CM | POA: Insufficient documentation

## 2016-12-23 DIAGNOSIS — Y990 Civilian activity done for income or pay: Secondary | ICD-10-CM | POA: Insufficient documentation

## 2016-12-23 DIAGNOSIS — Z23 Encounter for immunization: Secondary | ICD-10-CM | POA: Insufficient documentation

## 2016-12-23 DIAGNOSIS — Y939 Activity, unspecified: Secondary | ICD-10-CM | POA: Insufficient documentation

## 2016-12-23 DIAGNOSIS — Y929 Unspecified place or not applicable: Secondary | ICD-10-CM | POA: Insufficient documentation

## 2016-12-23 DIAGNOSIS — W231XXA Caught, crushed, jammed, or pinched between stationary objects, initial encounter: Secondary | ICD-10-CM | POA: Insufficient documentation

## 2016-12-23 MED ORDER — TETANUS-DIPHTH-ACELL PERTUSSIS 5-2.5-18.5 LF-MCG/0.5 IM SUSP
0.5000 mL | Freq: Once | INTRAMUSCULAR | Status: AC
  Administered 2016-12-23: 0.5 mL via INTRAMUSCULAR
  Filled 2016-12-23: qty 0.5

## 2016-12-23 MED ORDER — HYDROMORPHONE HCL 1 MG/ML IJ SOLN
1.0000 mg | Freq: Once | INTRAMUSCULAR | Status: AC
  Administered 2016-12-23: 1 mg via INTRAMUSCULAR
  Filled 2016-12-23: qty 1

## 2016-12-23 MED ORDER — BACITRACIN ZINC 500 UNIT/GM EX OINT
TOPICAL_OINTMENT | Freq: Two times a day (BID) | CUTANEOUS | Status: DC
  Administered 2016-12-23: 12:00:00 via TOPICAL
  Filled 2016-12-23: qty 28.35

## 2016-12-23 MED ORDER — BUPIVACAINE HCL (PF) 0.25 % IJ SOLN: 20.0000 mL | Freq: Once | INTRAMUSCULAR | Status: DC

## 2016-12-23 MED ORDER — LIDOCAINE HCL 2 % IJ SOLN: 20.0000 mL | Freq: Once | INTRAMUSCULAR | Status: DC

## 2016-12-23 MED ORDER — IBUPROFEN 400 MG PO TABS
600.0000 mg | ORAL_TABLET | Freq: Once | ORAL | Status: AC
  Administered 2016-12-23: 11:00:00 600 mg via ORAL
  Filled 2016-12-23: qty 1

## 2016-12-23 MED ORDER — ACETAMINOPHEN 325 MG PO TABS
650.0000 mg | ORAL_TABLET | Freq: Once | ORAL | Status: AC
  Administered 2016-12-23: 650 mg via ORAL
  Filled 2016-12-23: qty 2

## 2016-12-23 MED ORDER — BUPIVACAINE HCL 0.25 % IJ SOLN: 20.0000 mL | Freq: Once | INTRAMUSCULAR | Status: DC

## 2016-12-23 MED ORDER — CLINDAMYCIN PHOSPHATE 600 MG/50ML IV SOLN
600.0000 mg | Freq: Once | INTRAVENOUS | Status: DC
  Filled 2016-12-23: qty 50

## 2016-12-23 MED ORDER — HYDROCODONE-ACETAMINOPHEN 5-325 MG PO TABS
1.0000 | ORAL_TABLET | Freq: Once | ORAL | Status: DC
  Filled 2016-12-23: qty 1

## 2016-12-23 MED ORDER — LIDOCAINE HCL 1 % IJ SOLN
INTRAMUSCULAR | Status: AC
  Filled 2016-12-23: qty 10

## 2016-12-23 MED ORDER — OXYCODONE HCL 5 MG PO TABS
10.0000 mg | ORAL_TABLET | Freq: Once | ORAL | Status: AC
  Administered 2016-12-23: 10 mg via ORAL
  Filled 2016-12-23: qty 2

## 2016-12-23 MED ORDER — CLINDAMYCIN HCL 150 MG PO CAPS
450.0000 mg | ORAL_CAPSULE | Freq: Once | ORAL | Status: AC
  Administered 2016-12-23: 450 mg via ORAL
  Filled 2016-12-23: qty 3

## 2016-12-23 NOTE — Discharge Instructions (Addendum)
Please follow-up with Dr. Amanda PeaGramig as directed. Return to ED for any new or worsening symptoms.

## 2016-12-23 NOTE — ED Notes (Signed)
Dressing and cleaning completed by RN Florentina AddisonKatie

## 2016-12-23 NOTE — Consult Note (Signed)
Reason for Consult: Right thumb comminuted distal phalanx fracture Referring Physician: Dr. Merilynn Finland Ricky Buckley is an 36 y.o. male.  HPI: Patient is a very pleasant 36 year old gentleman who sustained an on-the-job injury earlier this morning he was working with an over 200 pound metal can which unfortunately crushed his right thumb. The patient had significant swelling and developing ecchymosis he was initially seen at the med Center in [facility] and transferred ultimate to [facility] for hand surgery consultation. He describes significant pain to the distal tip. Digital block was attempted prior to examination without significant success per the patient. Describes pain from the distal tip of the thumb proximally to the wrist region. We have discussed all issues with him. He denies any other injury to the remaining digits.  Past Medical History:  Diagnosis Date  . Ureterolithiasis     Past Surgical History:  Procedure Laterality Date  . CYSTOSCOPY/RETROGRADE/URETEROSCOPY/STONE EXTRACTION WITH BASKET Left 07/26/2016   Procedure: CYSTOSCOPY/LEFT RETROGRADE/LEFT URETEROSCOPY/STONE EXTRACTION WITH BASKET, LEFT URETERAL STENT PLACEMENT;  Surgeon: Marcine Matar, MD;  Location: [facility];  Service: Urology;  Laterality: Left;  . HOLMIUM LASER APPLICATION Left 07/26/2016   Procedure: HOLMIUM LASER APPLICATION;  Surgeon: Marcine Matar, MD;  Location: [facility];  Service: Urology;  Laterality: Left;    History reviewed. No pertinent family history.  Social History:  reports that he has never smoked. He has never used smokeless tobacco. He reports that he does not drink alcohol or use drugs.  Allergies:  Allergies  Allergen Reactions  . Penicillins Shortness Of Breath    Medications: No current home medications  No results found for this or any previous visit (from the past 48 hour(s)).  Dg Finger Thumb Right  Result Date: 12/23/2016 CLINICAL DATA:  Crush injury of the distal phalanx of  the thumb with lacerations. EXAM: RIGHT THUMB 2+V COMPARISON:  None. FINDINGS: There is a severely comminuted fracture of the distal aspect of the distal phalanx of the right thumb. IMPRESSION: Comminuted fracture of the distal phalanx of the right thumb. Electronically Signed   By: Francene Boyers M.D.   On: 12/23/2016 09:39    Review of Systems  Constitutional: Negative.   HENT: Negative.   Eyes: Negative.   Respiratory: Negative.   Cardiovascular: Negative.   Gastrointestinal: Negative.   Genitourinary:       History of nephrolithiasis  Musculoskeletal:       See history of present illness  Skin: Negative.   Endo/Heme/Allergies: Negative.    Blood pressure 133/82, pulse 64, temperature 98.6 F (37 C), temperature source Oral, resp. rate 16, height 6\' 3"  (1.905 m), weight (!) 158.8 kg (350 lb), SpO2 100 %. Physical Exam  The patient is alert and oriented in no acute distress. The patient complains of pain in the affected upper extremity.  The patient is noted to have a normal HEENT exam. Lung fields show equal chest expansion and no shortness of breath. Abdomen exam is nontender without distention. Lower extremity examination does not show any fracture dislocation or blood clot symptoms. Pelvis is stable and the neck and back are stable and nontender. Examination of the right upper extremity shows that he has obvious swelling and ecchymosis about the distal aspect of his thumb. I noted has multiple abrasions with pre-necrotic tissue to include skin and subcutaneous tissue exposed. He does not have a significant subungual hematoma at this juncture. He has significant swelling about the volar aspect of the thumb.Marland Kitchen His sensation is somewhat diminished given  the crush injury but gross sensation is intact. Refill is intact. FPL appears intact EPL appears intact however given the degree of the injury process is difficult to have the patient fully flex and extend.  Assessment/Plan: On-the-job  injury/crush injury to the right thumb with a notable comminuted distal phalanx fracture I have discussed with the patient all issues we have discussed with him his fracture pattern this is an extra-articular fracture and currently he lines up well on radiographs in both the AP and lateral plane we discussed with him will attempt closed treatment and immobilization with close observation, however, if he has displacement consideration for surgical intervention would need to be implemented. I have discussed with him proceeding with irrigation and excisional debridement of the significant abrasions are present today. After obtaining verbal consent we have copiously irrigated the thumb and cleansed this with antibacterial soap and water. Following this pickup and scissors were utilized to sharply excise pre-necrotic and necrotic tissue and skin to the level of the subcutaneous tissue he tolerated this very well and still had some remnants of his prior digital block thus a an additional digital block was not needed. Patient had good refill present and following this time nonadherent dressings in the form of Adaptic Xeroform were applied followed by soft dressings and a malleable finger splint to keep the thumb IP immobilized.  He tolerated the procedure very well and we have discussed with him diligent elevation, edema control and have discussed with him keeping his dressings clean dry and intact. We have given oxycodone for pain purposes and doxycycline for antibiotic purposes. I should note he does have a clindamycin at home from a prior renal stone and will complete this prior to starting his doxycycline. We will need to see him in 1 week for repeat radiographs and a wound check. I discussed with him activity modification at this juncture as he works in [employer] we would have him out of work until he sees us back in 1 week for repeat check. We discussed all do's and don'ts at length. All questions were  encouraged and answered.  Jaren Vanetten L 12/23/2016, 6:44 PM

## 2016-12-23 NOTE — ED Triage Notes (Signed)
Right thumb crush injury after getting thumb caught between 2 pieces of equipment.  Bleeding controlled. Swelling and bruising noted.

## 2016-12-23 NOTE — Progress Notes (Signed)
Pt has been to ED 4 times in the last several months with no PCP or insurance.  Added reminded to make follow up appointment [facility] [place] for RN to go over with pt at time of D/C.  No additional CM needs noted at this time.

## 2016-12-23 NOTE — ED Notes (Signed)
Pt family came out to the desk saying the pt was in so much pain and wanting some pain meds.

## 2016-12-23 NOTE — ED Provider Notes (Signed)
[facility]   CSN: [id number] Arrival date & time: 12/23/16  0900     History   Chief Complaint Chief Complaint  Patient presents with  . Finger Injury    HPI Ricky Buckley is a 36 y.o. male.  HPI 36 year old Caucasian male with no significant past medical history presents to the ED today with complaints of right thumb injury prior to arrival. Patient states that he was getting out of his car at work and was moving something and got his finger caught between 2 pieces of steel equipment. He reports associated pain with movement. Denies any weakness or paresthesias. Laceration noted. States that his tetanus is not up-to-date. He has not tried any further symptoms prior to arrival. Moving makes the pain worse. Nothing makes the pain better. Past Medical History:  Diagnosis Date  . Ureterolithiasis     Patient Active Problem List   Diagnosis Date Noted  . Anterior shoulder dislocation, left, initial encounter 07/16/2016    Past Surgical History:  Procedure Laterality Date  . CYSTOSCOPY/RETROGRADE/URETEROSCOPY/STONE EXTRACTION WITH BASKET Left 07/26/2016   Procedure: CYSTOSCOPY/LEFT RETROGRADE/LEFT URETEROSCOPY/STONE EXTRACTION WITH BASKET, LEFT URETERAL STENT PLACEMENT;  Surgeon: Marcine Matar, MD;  Location: [facility];  Service: Urology;  Laterality: Left;  . HOLMIUM LASER APPLICATION Left 07/26/2016   Procedure: HOLMIUM LASER APPLICATION;  Surgeon: Marcine Matar, MD;  Location: [facility];  Service: Urology;  Laterality: Left;       Home Medications    Prior to Admission medications   Not on File    Family History History reviewed. No pertinent family history.  Social History Social History  Substance Use Topics  . Smoking status: Never Smoker  . Smokeless tobacco: Never Used  . Alcohol use No     Allergies   Penicillins   Review of Systems Review of Systems  Constitutional: Negative for chills and fever.  Musculoskeletal:  Positive for arthralgias and joint swelling.  Skin: Positive for wound.  Neurological: Negative for weakness and numbness.     Physical Exam Updated Vital Signs BP 139/90 (BP Location: Left Arm)   Pulse 74   Temp 98.6 F (37 C) (Oral)   Resp 18   Ht 6\' 3"  (1.905 m)   Wt (!) 158.8 kg (350 lb)   SpO2 100%   BMI 43.75 kg/m   Physical Exam  Constitutional: He appears well-developed and well-nourished. No distress.  HENT:  Head: Normocephalic and atraumatic.  Eyes: Right eye exhibits no discharge. Left eye exhibits no discharge. No scleral icterus.  Neck: Normal range of motion.  Cardiovascular: Intact distal pulses.   Pulmonary/Chest: No respiratory distress.  Musculoskeletal: Normal range of motion.  Crush injury noted to the right thumb. 2 small avulsion wounds are noted to the dorsal aspect of the distal thumb. The nail bed is intact. Small subungual hematoma that is less than 30% of the nailbed. Edema, ecchymosis noted. Limited range of motion due to pain and swelling. Radial pulses are 2+ bilaterally. Sensation intact. Cap refill normal.  Neurological: He is alert.  Skin: Skin is warm and dry. No pallor.  Psychiatric: His behavior is normal. Judgment and thought content normal.  Nursing note and vitals reviewed.      ED Treatments / Results  Labs (all labs ordered are listed, but only abnormal results are displayed) Labs Reviewed - No data to display  EKG  EKG Interpretation None       Radiology Dg Finger Thumb Right  Result Date: 12/23/2016 CLINICAL DATA:  Crush injury of the distal phalanx of the thumb with lacerations. EXAM: RIGHT THUMB 2+V COMPARISON:  None. FINDINGS: There is a severely comminuted fracture of the distal aspect of the distal phalanx of the right thumb. IMPRESSION: Comminuted fracture of the distal phalanx of the right thumb. Electronically Signed   By: Francene BoyersJames  Maxwell M.D.   On: 12/23/2016 09:39    Procedures .Nerve Block Date/Time:  12/23/2016 12:34 PM Performed by: Demetrios LollLEAPHART, Nakyiah Kuck T Authorized by: Demetrios LollLEAPHART, Chyan Carnero T   Consent:    Consent given by:  Patient   Risks discussed:  Allergic reaction, bleeding, intravenous injection, infection, nerve damage, pain, unsuccessful block and swelling   Alternatives discussed:  No treatment Indications:    Indications:  Pain relief Location:    Body area:  Upper extremity   Upper extremity nerve blocked: right thumb.   Laterality:  Right Pre-procedure details:    Skin preparation:  2% chlorhexidine Skin anesthesia (see MAR for exact dosages):    Skin anesthesia method:  None Procedure details (see MAR for exact dosages):    Block needle gauge:  27 G   Anesthetic injected:  Lidocaine 1% w/o epi   Injection procedure:  Anatomic landmarks identified, introduced needle, incremental injection, negative aspiration for blood and anatomic landmarks palpated Post-procedure details:    Dressing:  Sterile dressing   Outcome:  Pain improved   Patient tolerance of procedure:  Tolerated well, no immediate complications   (including critical care time)  Medications Ordered in ED Medications  bacitracin ointment ( Topical Given 12/23/16 1215)  lidocaine (XYLOCAINE) 1 % (with pres) injection (not administered)  Tdap (BOOSTRIX) injection 0.5 mL (0.5 mLs Intramuscular Given 12/23/16 1112)  ibuprofen (ADVIL,MOTRIN) tablet 600 mg (600 mg Oral Given 12/23/16 1110)  acetaminophen (TYLENOL) tablet 650 mg (650 mg Oral Given 12/23/16 1110)  clindamycin (CLEOCIN) capsule 450 mg (450 mg Oral Given 12/23/16 1225)     Initial Impression / Assessment and Plan / ED Course  I have reviewed the triage vital signs and the nursing notes.  Pertinent labs & imaging results that were available during my care of the patient were reviewed by me and considered in my medical decision making (see chart for details).     Patient presents to the ED with crush injury to the right thumb. 2 small avulsion  abrasions noted. However I do not appreciate any open fracture. There is a small subungual hematoma but the nailbed is intact. Patient is neurovascularly intact. Limited range of motion due to pain. X-ray does show a comminuted distal phalanx fracture. Spoke with Dr. Amanda PeaGramig with hand surgery who evaluated patient x-ray and imaging. Would [facility] in the ED. Recommends antibiotic at this time. Patient has penicillin allergy. Unable to obtain IV access. Patient given by mouth clindamycin. Spoke with Dr. Juleen ChinaKohut who will accept patient in [facility]. Once patient arrives in the ED doctor Gramig needs to be notified the patient has arrived. Wound was clean and dressed in ED. Remains neurovascularly intact. [facility].  Final Clinical Impressions(s) / ED Diagnoses   Final diagnoses:  Closed nondisplaced fracture of distal phalanx of left thumb, initial encounter    New Prescriptions New Prescriptions   No medications on file     Wallace KellerLeaphart, Geraline Halberstadt T, PA-C 12/23/16 1235    Tegeler, Canary Brimhristopher J, MD 12/24/16 [phone]

## 2017-01-03 ENCOUNTER — Emergency Department (HOSPITAL_COMMUNITY)
Admission: EM | Admit: 2017-01-03 | Discharge: 2017-01-03 | Disposition: A | Discharge status: Home / Self Care | Payer: Self-pay | Attending: Emergency Medicine | Admitting: Emergency Medicine

## 2017-01-03 ENCOUNTER — Emergency Department (HOSPITAL_COMMUNITY): Payer: Self-pay

## 2017-01-03 DIAGNOSIS — Y929 Unspecified place or not applicable: Secondary | ICD-10-CM | POA: Insufficient documentation

## 2017-01-03 DIAGNOSIS — Y939 Activity, unspecified: Secondary | ICD-10-CM | POA: Insufficient documentation

## 2017-01-03 DIAGNOSIS — W230XXA Caught, crushed, jammed, or pinched between moving objects, initial encounter: Secondary | ICD-10-CM | POA: Insufficient documentation

## 2017-01-03 DIAGNOSIS — S62521A Displaced fracture of distal phalanx of right thumb, initial encounter for closed fracture: Secondary | ICD-10-CM | POA: Insufficient documentation

## 2017-01-03 DIAGNOSIS — Y999 Unspecified external cause status: Secondary | ICD-10-CM | POA: Insufficient documentation

## 2017-01-03 MED ORDER — OXYCODONE-ACETAMINOPHEN 5-325 MG PO TABS: 1.0000 | ORAL_TABLET | Freq: Four times a day (QID) | ORAL | 0 refills | Status: DC | PRN

## 2017-01-03 MED ORDER — IBUPROFEN 600 MG PO TABS: 600.0000 mg | ORAL_TABLET | Freq: Four times a day (QID) | ORAL | 0 refills | Status: DC | PRN

## 2017-01-03 MED ORDER — OXYCODONE-ACETAMINOPHEN 5-325 MG PO TABS
1.0000 | ORAL_TABLET | Freq: Once | ORAL | Status: AC
  Administered 2017-01-03: 1 via ORAL
  Filled 2017-01-03: qty 1
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
## 2017-01-03 NOTE — Discharge Instructions (Signed)
Medications: Percocet  Treatment: Take 1-2 Percocet every 4-6 hours as needed for severe pain. Alternate with ibuprofen as prescribed. Please wear your splint at all times.  Follow-up: Please follow-up with Dr. Amanda PeaGramig for further evaluation and treatment of your injury. Please return to the emergency department if you develop any new or worsening symptoms.  Do not drink alcohol, drive, operate machinery or participate in any other potentially dangerous activities while taking opiate pain medication as it may make you sleepy. Do not take this medication with any other sedating medications, either prescription or over-the-counter. If you were prescribed Percocet or Vicodin, do not take these with acetaminophen (Tylenol) as it is already contained within these medications and overdose of Tylenol is dangerous.   This medication is an opiate (or narcotic) pain medication and can be habit forming.  Use it as little as possible to achieve adequate pain control.  Do not use or use it with extreme caution if you have a history of opiate abuse or dependence. This medication is intended for your use only - do not give any to anyone else and keep it in a secure place where nobody else, especially children, have access to it. It will also cause or worsen constipation, so you may want to consider taking an over-the-counter stool softener while you are taking this medication.

## 2017-01-03 NOTE — ED Triage Notes (Signed)
Pt from home with c/o right hand pain following getting his hand shut in a car door. Pt is able to move all of his fingers on affected hand and he has radial pulse on right wrist. Pt rates pain 10/10. Pt was seen for crush injury of same hand on 7/10

## 2017-02-10 ENCOUNTER — Ambulatory Visit (INDEPENDENT_AMBULATORY_CARE_PROVIDER_SITE_OTHER): Payer: Self-pay

## 2017-02-10 ENCOUNTER — Ambulatory Visit (INDEPENDENT_AMBULATORY_CARE_PROVIDER_SITE_OTHER): Payer: Worker's Compensation | Admitting: Orthopaedic Surgery

## 2017-02-10 ENCOUNTER — Encounter (INDEPENDENT_AMBULATORY_CARE_PROVIDER_SITE_OTHER): Payer: Self-pay | Admitting: Orthopaedic Surgery

## 2017-02-10 VITALS — BP 105/71 | HR 68 | Ht 75.0 in | Wt 347.0 lb

## 2017-02-10 DIAGNOSIS — S62521A Displaced fracture of distal phalanx of right thumb, initial encounter for closed fracture: Secondary | ICD-10-CM | POA: Insufficient documentation

## 2017-02-10 DIAGNOSIS — S62521D Displaced fracture of distal phalanx of right thumb, subsequent encounter for fracture with routine healing: Secondary | ICD-10-CM | POA: Diagnosis not present

## 2017-02-10 DIAGNOSIS — M79644 Pain in right finger(s): Secondary | ICD-10-CM | POA: Diagnosis not present
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
## 2017-03-17 ENCOUNTER — Emergency Department (HOSPITAL_BASED_OUTPATIENT_CLINIC_OR_DEPARTMENT_OTHER)
Admission: EM | Admit: 2017-03-17 | Discharge: 2017-03-17 | Disposition: A | Discharge status: Home / Self Care | Payer: Self-pay | Attending: Emergency Medicine | Admitting: Emergency Medicine

## 2017-03-17 ENCOUNTER — Encounter (HOSPITAL_BASED_OUTPATIENT_CLINIC_OR_DEPARTMENT_OTHER): Payer: Self-pay

## 2017-03-17 ENCOUNTER — Ambulatory Visit (INDEPENDENT_AMBULATORY_CARE_PROVIDER_SITE_OTHER): Payer: Worker's Compensation | Admitting: Orthopaedic Surgery

## 2017-03-17 ENCOUNTER — Emergency Department (HOSPITAL_BASED_OUTPATIENT_CLINIC_OR_DEPARTMENT_OTHER): Payer: Self-pay

## 2017-03-17 DIAGNOSIS — M25561 Pain in right knee: Secondary | ICD-10-CM | POA: Insufficient documentation

## 2017-03-17 DIAGNOSIS — S8991XA Unspecified injury of right lower leg, initial encounter: Secondary | ICD-10-CM | POA: Insufficient documentation

## 2017-03-17 DIAGNOSIS — Y9301 Activity, walking, marching and hiking: Secondary | ICD-10-CM | POA: Insufficient documentation

## 2017-03-17 DIAGNOSIS — Y999 Unspecified external cause status: Secondary | ICD-10-CM | POA: Insufficient documentation

## 2017-03-17 DIAGNOSIS — Y929 Unspecified place or not applicable: Secondary | ICD-10-CM | POA: Insufficient documentation

## 2017-03-17 DIAGNOSIS — W108XXA Fall (on) (from) other stairs and steps, initial encounter: Secondary | ICD-10-CM | POA: Insufficient documentation

## 2017-03-17 MED ORDER — HYDROCODONE-ACETAMINOPHEN 5-325 MG PO TABS
1.0000 | ORAL_TABLET | Freq: Once | ORAL | Status: AC
  Administered 2017-03-17: 1 via ORAL
  Filled 2017-03-17: qty 1

## 2017-03-17 MED ORDER — IBUPROFEN 800 MG PO TABS
800.0000 mg | ORAL_TABLET | Freq: Once | ORAL | Status: AC
  Administered 2017-03-17: 800 mg via ORAL
  Filled 2017-03-17: qty 1

## 2017-03-17 MED ORDER — ONDANSETRON 8 MG PO TBDP
8.0000 mg | ORAL_TABLET | Freq: Once | ORAL | Status: AC
  Administered 2017-03-17: 8 mg via ORAL
  Filled 2017-03-17: qty 1

## 2017-03-17 MED ORDER — HYDROCODONE-ACETAMINOPHEN 5-325 MG PO TABS: 1.0000 | ORAL_TABLET | Freq: Four times a day (QID) | ORAL | 0 refills | Status: DC | PRN

## 2017-03-17 MED ORDER — IBUPROFEN 800 MG PO TABS: 800.0000 mg | ORAL_TABLET | Freq: Three times a day (TID) | ORAL | 0 refills | Status: DC | PRN

## 2017-03-17 NOTE — Discharge Instructions (Signed)
The x-rays did not show any abnormalities. You will need to see the orthopedist for further evaluation.

## 2017-03-17 NOTE — ED Notes (Signed)
Patient transported to X-ray 

## 2017-03-17 NOTE — ED Notes (Signed)
Right knee is slightly swolen and patient stated that he was dragging right leg when he walks.   He stated that he could hardly pick it up.   He stated that his right leg, starting above the knee goes numb and shoots all the way to his foot and he felt that his right toes are tingling.

## 2017-03-17 NOTE — ED Triage Notes (Signed)
Pt states he missed a step last night and his right knee went inward; pt rates pain 9/10.

## 2017-03-20 NOTE — ED Provider Notes (Signed)
[facility]-EMERGENCY DEPT Provider Note   CSN: [id number] Arrival date & time: 03/17/17  1457     History   Chief Complaint Chief Complaint  Patient presents with  . Knee Pain    HPI Ricky Buckley is a 36 y.o. male.  HPI Patient presents to the emergency department with right knee pain that started after missing a step last night and twisting his knee.  The patient states that he twisted his knee and his knee went behind him.  The patient states that movement and palpation make the pain worse.  He said certain edition's also make the pain worse.  The patient states he did not take any medications prior to arrival.  Patient states that he has pain that shoots down into his leg with tingling.  Patient states that he was unable to bear weight due to the pain.  The nursing note states that he was dragging his leg.  The patient denies this to me.  Patient denies any other injuries.  Past Medical History:  Diagnosis Date  . Ureterolithiasis     Patient Active Problem List   Diagnosis Date Noted  . Closed displaced fracture of distal phalanx of right thumb 02/10/2017  . Anterior shoulder dislocation, left, initial encounter 07/16/2016    Past Surgical History:  Procedure Laterality Date  . CYSTOSCOPY/RETROGRADE/URETEROSCOPY/STONE EXTRACTION WITH BASKET Left 07/26/2016   Procedure: CYSTOSCOPY/LEFT RETROGRADE/LEFT URETEROSCOPY/STONE EXTRACTION WITH BASKET, LEFT URETERAL STENT PLACEMENT;  Surgeon: Marcine Matar, MD;  Location: [facility];  Service: Urology;  Laterality: Left;  . HOLMIUM LASER APPLICATION Left 07/26/2016   Procedure: HOLMIUM LASER APPLICATION;  Surgeon: Marcine Matar, MD;  Location: [facility];  Service: Urology;  Laterality: Left;       Home Medications    Prior to Admission medications   Medication Sig Start Date End Date Taking? Authorizing Provider  HYDROcodone-acetaminophen (NORCO/VICODIN) 5-325 MG tablet Take 1 tablet by mouth every 6 (six) hours as needed for  moderate pain. 03/17/17   Jacquie Lukes, Cristal Deer, PA-C  ibuprofen (ADVIL,MOTRIN) 800 MG tablet Take 1 tablet (800 mg total) by mouth every 8 (eight) hours as needed. 03/17/17   Shira Bobst, Cristal Deer, PA-C  oxyCODONE-acetaminophen (PERCOCET/ROXICET) 5-325 MG tablet Take 1-2 tablets by mouth every 6 (six) hours as needed for severe pain. Patient not taking: Reported on 02/10/2017 01/03/17   Emi Holes, PA-C    Family History No family history on file.  Social History Social History  Substance Use Topics  . Smoking status: Never Smoker  . Smokeless tobacco: Never Used  . Alcohol use No     Allergies   Penicillins   Review of Systems Review of Systems All other systems negative except as documented in the HPI. All pertinent positives and negatives as reviewed in the HPI.  Physical Exam Updated Vital Signs BP 107/89   Pulse 61   Temp 98.7 F (37.1 C) (Oral)   Resp 16   Ht  (1.905 m)   Wt (!) 167.8 kg (370 lb)   SpO2 98%   BMI 46.25 kg/m   Physical Exam  Constitutional: He is oriented to person, place, and time. He appears well-developed and well-nourished. No distress.  HENT:  Head: Normocephalic and atraumatic.  Eyes: Pupils are equal, round, and reactive to light.  Pulmonary/Chest: Effort normal.  Musculoskeletal:       Right knee: He exhibits decreased range of motion and swelling. He exhibits no ecchymosis, no deformity, no laceration, normal alignment, normal patellar mobility and normal meniscus. Tenderness found.  Neurological: He is alert and oriented to person, place, and time.  Skin: Skin is warm and dry.  Psychiatric: He has a normal mood and affect.  Nursing note and vitals reviewed.    ED Treatments / Results  Labs (all labs ordered are listed, but only abnormal results are displayed) Labs Reviewed - No data to display  EKG  EKG Interpretation None       Radiology No results found.  Procedures Procedures (including critical care  time)  Medications Ordered in ED Medications  HYDROcodone-acetaminophen (NORCO/VICODIN) 5-325 MG per tablet 1 tablet (1 tablet Oral Given 03/17/17 1641)  ondansetron (ZOFRAN-ODT) disintegrating tablet 8 mg (8 mg Oral Given 03/17/17 1640)  ibuprofen (ADVIL,MOTRIN) tablet 800 mg (800 mg Oral Given 03/17/17 1641)     Initial Impression / Assessment and Plan / ED Course  I have reviewed the triage vital signs and the nursing notes.  Pertinent labs & imaging results that were available during my care of the patient were reviewed by me and considered in my medical decision making (see chart for details).     Patient will be placed in a knee immobilizer with crutches.  Told to follow-up with orthopedics.  The [place] did not show any abnormality.  The patient agrees the plan and all questions were answered.  At this point, I do not have a clear answer as to what the cause of his knee pain is other than internal structures being damaged Final Clinical Impressions(s) / ED Diagnoses   Final diagnoses:  Injury of right knee, initial encounter  Acute pain of right knee    New Prescriptions Discharge Medication List as of 03/17/2017  5:31 PM    START taking these medications   Details  HYDROcodone-acetaminophen (NORCO/VICODIN) 5-325 MG tablet Take 1 tablet by mouth every 6 (six) hours as needed for moderate pain., Starting Tue 03/17/2017, Print         Lorenz Donley, [place], PA-C 03/20/17 Danford Bad, MD 03/21/17 [phone]

## 2017-04-10 ENCOUNTER — Telehealth (INDEPENDENT_AMBULATORY_CARE_PROVIDER_SITE_OTHER): Payer: Self-pay | Admitting: Orthopaedic Surgery

## 2017-04-10 NOTE — Telephone Encounter (Signed)
noted 

## 2017-04-10 NOTE — Telephone Encounter (Signed)
Ricky Buckley with [facility] called left voicemail message advised the patient's attorney referred patient asking if there company can be used for the work comp medication. She advised this is a overnight delivery service. Ricky Buckley advised if there pharmacy was to be used the address is   [facility] [place] [place] [id number]   The main number is [phone]  The fax# is [phone]

## 2017-04-14 ENCOUNTER — Ambulatory Visit (INDEPENDENT_AMBULATORY_CARE_PROVIDER_SITE_OTHER): Payer: Worker's Compensation | Admitting: Orthopaedic Surgery

## 2017-05-16 ENCOUNTER — Emergency Department (HOSPITAL_BASED_OUTPATIENT_CLINIC_OR_DEPARTMENT_OTHER): Payer: Self-pay

## 2017-05-16 ENCOUNTER — Encounter (HOSPITAL_BASED_OUTPATIENT_CLINIC_OR_DEPARTMENT_OTHER): Payer: Self-pay | Admitting: Emergency Medicine

## 2017-05-16 ENCOUNTER — Other Ambulatory Visit: Payer: Self-pay

## 2017-05-16 ENCOUNTER — Emergency Department (HOSPITAL_BASED_OUTPATIENT_CLINIC_OR_DEPARTMENT_OTHER)
Admission: EM | Admit: 2017-05-16 | Discharge: 2017-05-17 | Disposition: A | Discharge status: Home / Self Care | Payer: Self-pay | Attending: Emergency Medicine | Admitting: Emergency Medicine

## 2017-05-16 DIAGNOSIS — R109 Unspecified abdominal pain: Secondary | ICD-10-CM

## 2017-05-16 DIAGNOSIS — R1031 Right lower quadrant pain: Secondary | ICD-10-CM | POA: Insufficient documentation

## 2017-05-16 LAB — BASIC METABOLIC PANEL
ANION GAP: 8 (ref 5–15)
BUN: 10 mg/dL (ref 6–20)
CO2: 23 mmol/L (ref 22–32)
Calcium: 8.9 mg/dL (ref 8.9–10.3)
Chloride: 102 mmol/L (ref 101–111)
Creatinine, Ser: 0.82 mg/dL (ref 0.61–1.24)
GFR calc Af Amer: >60 mL/min (ref >60)
GLUCOSE: 96 mg/dL (ref 65–99)
POTASSIUM: 4.1 mmol/L (ref 3.5–5.1)
Sodium: 133 mmol/L — ABNORMAL LOW (ref 135–145)

## 2017-05-16 LAB — URINALYSIS, ROUTINE W REFLEX MICROSCOPIC
Bilirubin Urine: NEGATIVE
Glucose, UA: NEGATIVE mg/dL
Hgb urine dipstick: NEGATIVE
Ketones, ur: NEGATIVE mg/dL
LEUKOCYTES UA: NEGATIVE
NITRITE: NEGATIVE
Protein, ur: NEGATIVE mg/dL
pH: 6 (ref 5.0–8.0)

## 2017-05-16 MED ORDER — HYDROMORPHONE HCL 1 MG/ML IJ SOLN
1.0000 mg | Freq: Once | INTRAMUSCULAR | Status: AC
  Administered 2017-05-16: 1 mg via INTRAVENOUS
  Filled 2017-05-16: qty 1

## 2017-05-16 MED ORDER — ONDANSETRON HCL 4 MG/2ML IJ SOLN
4.0000 mg | Freq: Once | INTRAMUSCULAR | Status: AC
  Administered 2017-05-16: 4 mg via INTRAVENOUS
  Filled 2017-05-16: qty 2

## 2017-05-16 MED ORDER — KETOROLAC TROMETHAMINE 30 MG/ML IJ SOLN
30.0000 mg | Freq: Once | INTRAMUSCULAR | Status: AC
  Administered 2017-05-16: 30 mg via INTRAVENOUS
  Filled 2017-05-16: qty 1

## 2017-05-16 NOTE — ED Notes (Addendum)
Pt c/o of increasing right flank pain. MD aware and orders received.

## 2017-05-16 NOTE — ED Triage Notes (Signed)
Reports bilateral flank pain x 2 days.  States that pain has gotten worse over the past few days.  Reports dysuria, N/V.  Reports hx of kidney stones.

## 2017-05-16 NOTE — ED Notes (Signed)
2nd IV site attempted without success.

## 2017-05-16 NOTE — ED Provider Notes (Signed)
[facility] EMERGENCY DEPARTMENT Provider Note   CSN: [id number] Arrival date & time: 05/16/17  1819     History   Chief Complaint Chief Complaint  Patient presents with  . Flank Pain    HPI Ricky Buckley is a 36 y.o. male.  Patient is a 36 year old male with a history of kidney stones he is presents with right flank pain.  He states that started about 2 days ago and is gradually gotten worse since that time.  He has some nausea but no vomiting.  He denies any dysuria.  No fevers.  He has had to have lithotripsy previously.  He was admitted in February for intractable pain related to kidney stones and saw Dr. Hillis Rangeahlstadt in the hospital but has not followed up as an outpatient with urology.  He takes Bayer back and bone pain medication without improvement in symptoms      Past Medical History:  Diagnosis Date  . Ureterolithiasis     Patient Active Problem List   Diagnosis Date Noted  . Closed displaced fracture of distal phalanx of right thumb 02/10/2017  . Anterior shoulder dislocation, left, initial encounter 07/16/2016    Past Surgical History:  Procedure Laterality Date  . CYSTOSCOPY/RETROGRADE/URETEROSCOPY/STONE EXTRACTION WITH BASKET Left 07/26/2016   Procedure: CYSTOSCOPY/LEFT RETROGRADE/LEFT URETEROSCOPY/STONE EXTRACTION WITH BASKET, LEFT URETERAL STENT PLACEMENT;  Surgeon: Marcine MatarStephen Dahlstedt, MD;  Location: [facility];  Service: Urology;  Laterality: Left;  . HOLMIUM LASER APPLICATION Left 07/26/2016   Procedure: HOLMIUM LASER APPLICATION;  Surgeon: Marcine MatarStephen Dahlstedt, MD;  Location: [facility];  Service: Urology;  Laterality: Left;       Home Medications    Prior to Admission medications   Medication Sig Start Date End Date Taking? Authorizing Provider  HYDROcodone-acetaminophen (NORCO/VICODIN) 5-325 MG tablet Take 1 tablet by mouth every 6 (six) hours as needed for moderate pain. 03/17/17   Lawyer, Cristal Deerhristopher, PA-C  ibuprofen (ADVIL,MOTRIN) 800 MG tablet Take  1 tablet (800 mg total) by mouth every 8 (eight) hours as needed. 03/17/17   Lawyer, Cristal Deerhristopher, PA-C  oxyCODONE-acetaminophen (PERCOCET/ROXICET) 5-325 MG tablet Take 1-2 tablets by mouth every 6 (six) hours as needed for severe pain. Patient not taking: Reported on 02/10/2017 01/03/17   Emi HolesLaw, Alexandra M, PA-C  traMADol (ULTRAM) 50 MG tablet Take 1 tablet (50 mg total) by mouth every 6 (six) hours as needed. 05/17/17   Rolan BuccoBelfi, Gracemarie Skeet, MD    Family History History reviewed. No pertinent family history.  Social History Social History   Tobacco Use  . Smoking status: Never Smoker  . Smokeless tobacco: Never Used  Substance Use Topics  . Alcohol use: No  . Drug use: No     Allergies   Penicillins   Review of Systems Review of Systems  Constitutional: Negative for chills, diaphoresis, fatigue and fever.  HENT: Negative for congestion, rhinorrhea and sneezing.   Eyes: Negative.   Respiratory: Negative for cough, chest tightness and shortness of breath.   Cardiovascular: Negative for chest pain and leg swelling.  Gastrointestinal: Positive for nausea. Negative for abdominal pain, blood in stool, diarrhea and vomiting.  Genitourinary: Positive for flank pain. Negative for difficulty urinating, frequency and hematuria.  Musculoskeletal: Positive for back pain. Negative for arthralgias.  Skin: Negative for rash.  Neurological: Negative for dizziness, speech difficulty, weakness, numbness and headaches.     Physical Exam Updated Vital Signs BP 128/89   Pulse 82   Temp 98.8 F (37.1 C) (Oral)   Resp 20   Ht 6'  3" (1.905 m)   Wt (!) 167.8 kg (370 lb)   SpO2 95%   BMI 46.25 kg/m   Physical Exam  Constitutional: He is oriented to person, place, and time. He appears well-developed and well-nourished.  HENT:  Head: Normocephalic and atraumatic.  Eyes: Pupils are equal, round, and reactive to light.  Neck: Normal range of motion. Neck supple.  Cardiovascular: Normal rate,  regular rhythm and normal heart sounds.  Pulmonary/Chest: Effort normal and breath sounds normal. No respiratory distress. He has no wheezes. He has no rales. He exhibits no tenderness.  Abdominal: Soft. Bowel sounds are normal. There is no tenderness. There is no rebound and no guarding.  Positive right CVA tenderness  Musculoskeletal: Normal range of motion. He exhibits no edema.  Lymphadenopathy:    He has no cervical adenopathy.  Neurological: He is alert and oriented to person, place, and time.  Skin: Skin is warm and dry. No rash noted.  Psychiatric: He has a normal mood and affect.     ED Treatments / Results  Labs (all labs ordered are listed, but only abnormal results are displayed) Labs Reviewed  URINALYSIS, ROUTINE W REFLEX MICROSCOPIC - Abnormal; Notable for the following components:      Result Value   Color, Urine STRAW (*)    Specific Gravity, Urine <1.005 (*)    All other components within normal limits  BASIC METABOLIC PANEL - Abnormal; Notable for the following components:   Sodium 133 (*)    All other components within normal limits    EKG  EKG Interpretation None       Radiology Ct Renal Stone Study  Result Date: 05/16/2017 CLINICAL DATA:  Bilateral flank pain for 2 days. History of kidney stone. EXAM: CT ABDOMEN AND PELVIS WITHOUT CONTRAST TECHNIQUE: Multidetector CT imaging of the abdomen and pelvis was performed following the standard protocol without IV contrast. COMPARISON:  CT 07/26/2016 FINDINGS: Lower chest: The lung bases are clear. Hepatobiliary: Prominent liver spanning 22 cm cranial caudal. No evidence of focal lesion allowing for lack contrast. Possible layering sludge or noncalcified stones in the gallbladder. No pericholecystic inflammation or biliary dilatation. Pancreas: No ductal dilatation or inflammation. Spleen: Upper normal in size spanning 14 cm.  No focal abnormality. Adrenals/Urinary Tract: Normal adrenal glands. No hydronephrosis or  perinephric edema. Multiple bilateral nonobstructing stones in the right kidney. Previous left urolithiasis has diminished, with possible faint punctate nonobstructing stone. Cortical scarring in the upper right kidney. Exophytic right renal cyst again seen. Ureters are decompressed without stones along the course. Urinary bladder is physiologically distended without stone or wall thickening. No urethral stone. Stomach/Bowel: Stomach is within normal limits. Appendix appears normal. No evidence of bowel wall thickening, distention, or inflammatory changes. Vascular/Lymphatic: Normal caliber abdominal aorta. Small retroperitoneal and periportal nodes are not enlarged by size criteria. Reproductive: Prostate is unremarkable. Other: No free air, free fluid, or intra-abdominal fluid collection. Musculoskeletal: There are no acute or suspicious osseous abnormalities. Facet arthropathy in the lumbar spine. IMPRESSION: 1. Nonobstructing stones in the right and possibly left kidney. Hydronephrosis or obstructive uropathy. No ureteral or bladder stones. 2. No acute abnormality in the abdomen/pelvis. 3. Mild hepatosplenomegaly. Possible gallbladder sludge or small stones, no pericholecystic inflammation. Electronically Signed   By: Rubye OaksMelanie  Ehinger M.D.   On: 05/16/2017 23:56    Procedures Procedures (including critical care time)  Medications Ordered in ED Medications  ketorolac (TORADOL) 30 MG/ML injection 30 mg (30 mg Intravenous Given 05/16/17 2208)  HYDROmorphone (DILAUDID) injection  1 mg (1 mg Intravenous Given 05/16/17 2209)  ondansetron (ZOFRAN) injection 4 mg (4 mg Intravenous Given 05/16/17 2208)  HYDROmorphone (DILAUDID) injection 1 mg (1 mg Intravenous Given 05/16/17 2349)     Initial Impression / Assessment and Plan / ED Course  I have reviewed the triage vital signs and the nursing notes.  Pertinent labs & imaging results that were available during my care of the patient were reviewed by me and  considered in my medical decision making (see chart for details).     Patient is a 36 year old male who presents with right back pain.  He states it feels like his prior kidney stones.  CT scan does not show evidence of ureteral stones.  There is no hydronephrosis.  His urine does not appear to be infected.  There is no hematuria.  He was given pain medication in the ED and feels little bit better.  He has had multiple opioid prescriptions over the last year, most recently being in October.  I did not feel comfortable giving him a another prescription for Vicodin or oxycodone which is what he had in the past.  I did give him prescription for tramadol.  He was referred to [facility] urology.  Return precautions were given.  Final Clinical Impressions(s) / ED Diagnoses   Final diagnoses:  Right flank pain    ED Discharge Orders        Ordered    traMADol (ULTRAM) 50 MG tablet  Every 6 hours PRN     05/17/17 0034       Rolan Bucco, MD 05/17/17 1610

## 2017-05-16 NOTE — ED Notes (Signed)
Alert, NAD, calm, interactive, resps e/u, speaking in clear complete sentences, no dyspnea noted, skin W&D, c/o flank and groin pain, R>L, onset 2d ago, no relief with Bayer back & body, mentions darker urine, and nausea with pain, h/o kidney stones, Dr. Dahlstedt[facility], (denies: sob, fever, VD, dizziness or visual changes).  EDP into room.

## 2017-05-17 MED ORDER — TRAMADOL HCL 50 MG PO TABS: 50.0000 mg | ORAL_TABLET | Freq: Four times a day (QID) | ORAL | 0 refills | Status: DC | PRN

## 2017-05-17 NOTE — ED Notes (Signed)
Given Rx x1, denies questions or needs, VSS, steady gait, ride present

## 2017-06-06 ENCOUNTER — Encounter (HOSPITAL_BASED_OUTPATIENT_CLINIC_OR_DEPARTMENT_OTHER): Payer: Self-pay | Admitting: Emergency Medicine

## 2017-06-06 ENCOUNTER — Emergency Department (HOSPITAL_BASED_OUTPATIENT_CLINIC_OR_DEPARTMENT_OTHER)
Admission: EM | Admit: 2017-06-06 | Discharge: 2017-06-06 | Disposition: A | Discharge status: Home / Self Care | Payer: Self-pay | Attending: Emergency Medicine | Admitting: Emergency Medicine

## 2017-06-06 ENCOUNTER — Emergency Department (HOSPITAL_BASED_OUTPATIENT_CLINIC_OR_DEPARTMENT_OTHER): Payer: Self-pay

## 2017-06-06 ENCOUNTER — Other Ambulatory Visit: Payer: Self-pay

## 2017-06-06 DIAGNOSIS — Y939 Activity, unspecified: Secondary | ICD-10-CM | POA: Insufficient documentation

## 2017-06-06 DIAGNOSIS — M545 Low back pain, unspecified: Secondary | ICD-10-CM

## 2017-06-06 DIAGNOSIS — W19XXXA Unspecified fall, initial encounter: Secondary | ICD-10-CM

## 2017-06-06 DIAGNOSIS — W102XXA Fall (on)(from) incline, initial encounter: Secondary | ICD-10-CM | POA: Insufficient documentation

## 2017-06-06 DIAGNOSIS — M79605 Pain in left leg: Secondary | ICD-10-CM | POA: Insufficient documentation

## 2017-06-06 DIAGNOSIS — R200 Anesthesia of skin: Secondary | ICD-10-CM | POA: Insufficient documentation

## 2017-06-06 DIAGNOSIS — Y929 Unspecified place or not applicable: Secondary | ICD-10-CM | POA: Insufficient documentation

## 2017-06-06 DIAGNOSIS — Y999 Unspecified external cause status: Secondary | ICD-10-CM | POA: Insufficient documentation

## 2017-06-06 MED ORDER — IBUPROFEN 400 MG PO TABS
600.0000 mg | ORAL_TABLET | Freq: Once | ORAL | Status: AC
  Administered 2017-06-06: 600 mg via ORAL
  Filled 2017-06-06: qty 1

## 2017-06-06 MED ORDER — OXYCODONE-ACETAMINOPHEN 5-325 MG PO TABS
1.0000 | ORAL_TABLET | Freq: Once | ORAL | Status: AC
  Administered 2017-06-06: 1 via ORAL
  Filled 2017-06-06: qty 1

## 2017-06-06 MED ORDER — TRAMADOL HCL 50 MG PO TABS: 50.0000 mg | ORAL_TABLET | Freq: Four times a day (QID) | ORAL | 0 refills | Status: DC | PRN

## 2017-06-06 NOTE — ED Notes (Signed)
Patient transported to X-ray 

## 2017-06-06 NOTE — ED Triage Notes (Signed)
Pt presents with c/o back and butt pain after fall this morning.

## 2017-06-06 NOTE — Discharge Instructions (Signed)
Your CT scan and x-ray showed no signs of acute fractures.  You do have a chronic changes that are consistent with your history of back problems.  Wouldld recommend sitting on a pillow to help with your pain.  Alternate ice and heat.  Motrin and Tylenol for pain. Short course of tramadol for pain that is not controlled by motrin and tyenol.  If you are unable to urinate this evening, have any worsening numbness or tingling in your legs, worsening pain or loss of control of your bowel or bladder return to the ED immediately.  Follow-up with a primary care doctor.  May need an MRI in the outpatient setting.

## 2017-06-06 NOTE — ED Notes (Signed)
Bladder scan done 105 ml urine noted in bladder. Pt also noted to have significant bruising to buttocks area where right and left gluteals meet. Wife states this has increased since pt arrived. Given ice pack.

## 2017-06-07 NOTE — ED Provider Notes (Signed)
[facility] EMERGENCY DEPARTMENT Provider Note   CSN: [id number] Arrival date & time: 06/06/17  1932     History   Chief Complaint Chief Complaint  Patient presents with  . Fall    HPI Ricky Buckley is a 36 y.o. male.  HPI 36 year old Caucasian male with no pertinent past medical history presents to the emergency department today for evaluation of back pain after mechanical fall prior to arrival.  Patient states that early this morning patient was walking downstairs and missed 1 of the stairs.  States that he fell onto his bottom and rolled down 10 stairs.  Patient denies any head injury or LOC.  Has been ambulatory since the event.  Reports bruising to his buttocks.  States that the pain radiates from his low back down to his left leg.  Does report some pelvic pain as well.  Patient has not taken anything for the pain prior to arrival.  Sitting and palpation make the pain worse.  Nothing makes the pain better.  Patient denies any associated abdominal pain, chest pain, nausea, emesis.  Patient states that he did have some loose stools after the event but denies any loss of bowel or bladder.  The patient states that he has a 2-3 times following the accident however he has not urinated in the past 2 hours.  Patient denies any saddle paresthesias.  He does report some intermittent paresthesias to his left leg.  The patient has a chronic back problems with history of fractures of his spine in the past.  Patient also reports a history of a bulging disc.  Has a history of cancer or IV drug use.   Past Medical History:  Diagnosis Date  . Ureterolithiasis     Patient Active Problem List   Diagnosis Date Noted  . Closed displaced fracture of distal phalanx of right thumb 02/10/2017  . Anterior shoulder dislocation, left, initial encounter 07/16/2016    Past Surgical History:  Procedure Laterality Date  . CYSTOSCOPY/RETROGRADE/URETEROSCOPY/STONE EXTRACTION WITH BASKET Left  07/26/2016   Procedure: CYSTOSCOPY/LEFT RETROGRADE/LEFT URETEROSCOPY/STONE EXTRACTION WITH BASKET, LEFT URETERAL STENT PLACEMENT;  Surgeon: Marcine Matar, MD;  Location: [facility];  Service: Urology;  Laterality: Left;  . HOLMIUM LASER APPLICATION Left 07/26/2016   Procedure: HOLMIUM LASER APPLICATION;  Surgeon: Marcine Matar, MD;  Location: [facility];  Service: Urology;  Laterality: Left;       Home Medications    Prior to Admission medications   Medication Sig Start Date End Date Taking? Authorizing Provider  HYDROcodone-acetaminophen (NORCO/VICODIN) 5-325 MG tablet Take 1 tablet by mouth every 6 (six) hours as needed for moderate pain. 03/17/17   Lawyer, Cristal Deer, PA-C  ibuprofen (ADVIL,MOTRIN) 800 MG tablet Take 1 tablet (800 mg total) by mouth every 8 (eight) hours as needed. 03/17/17   Lawyer, Cristal Deer, PA-C  oxyCODONE-acetaminophen (PERCOCET/ROXICET) 5-325 MG tablet Take 1-2 tablets by mouth every 6 (six) hours as needed for severe pain. Patient not taking: Reported on 02/10/2017 01/03/17   Emi Holes, PA-C  traMADol (ULTRAM) 50 MG tablet Take 1 tablet (50 mg total) by mouth every 6 (six) hours as needed. 06/06/17   Rise Mu, PA-C    Family History No family history on file.  Social History Social History   Tobacco Use  . Smoking status: Never Smoker  . Smokeless tobacco: Never Used  Substance Use Topics  . Alcohol use: No  . Drug use: No     Allergies   Penicillins   Review of  Systems Review of Systems  Eyes: Negative for visual disturbance.  Respiratory: Negative for shortness of breath.   Cardiovascular: Negative for chest pain.  Gastrointestinal: Negative for abdominal pain, diarrhea, nausea and vomiting.  Musculoskeletal: Positive for arthralgias, back pain and myalgias. Negative for gait problem, joint swelling, neck pain and neck stiffness.  Skin: Positive for color change. Negative for wound.  Neurological: Positive for numbness.  Negative for dizziness, weakness, light-headedness and headaches.     Physical Exam Updated Vital Signs BP 125/67   Pulse 68   Temp 98.2 F (36.8 C) (Oral)   Resp 20   SpO2 96%   Physical Exam Physical Exam  Constitutional: Pt is oriented to person, place, and time. Appears well-developed and well-nourished. No distress.  HENT:  Head: Normocephalic and atraumatic.  Ears: No bilateral hemotympanum. Nose: Nose normal. No septal hematoma. Mouth/Throat: Uvula is midline, oropharynx is clear and moist and mucous membranes are normal.  Eyes: Conjunctivae and EOM are normal. Pupils are equal, round, and reactive to light.  Neck: No spinous process tenderness and no muscular tenderness present. No rigidity. Normal range of motion present.  Full ROM without pain No midline cervical tenderness No crepitus, deformity or step-offs  No paraspinal tenderness  Cardiovascular: Normal rate, regular rhythm and intact distal pulses.   Pulses:      Radial pulses are 2+ on the right side, and 2+ on the left side.       Dorsalis pedis pulses are 2+ on the right side, and 2+ on the left side.       Posterior tibial pulses are 2+ on the right side, and 2+ on the left side.  Pulmonary/Chest: Effort normal and breath sounds normal. No accessory muscle usage. No respiratory distress. No decreased breath sounds. No wheezes. No rhonchi. No rales. Exhibits no tenderness and no bony tenderness.  No flail segment, crepitus or deformity Equal chest expansion  Abdominal: Soft. Normal appearance and bowel sounds are normal. There is no tenderness. There is no rigidity, no guarding and no CVA tenderness.  Abd soft and nontender  Musculoskeletal: Normal range of motion.       Thoracic back: Exhibits normal range of motion.       Lumbar back: Exhibits normal range of motion.  Full range of motion of the T-spine and L-spine midline tenderness to palpation of the spinous processes of the T-spine or L-spine No  crepitus, deformity or step-offs Mild tenderness to palpation of the paraspinous muscles of the L-spine  Lymphadenopathy:    Pt has no cervical adenopathy.  Neurological: Pt is alert and oriented to person, place, and time. Normal reflexes. No cranial nerve deficit. GCS eye subscore is 4. GCS verbal subscore is 5. GCS motor subscore is 6.  Reflex Scores:      Bicep reflexes are 2+ on the right side and 2+ on the left side.      Brachioradialis reflexes are 2+ on the right side and 2+ on the left side.      Patellar reflexes are 2+ on the right side and 2+ on the left side.      Achilles reflexes are 2+ on the right side and 2+ on the left side. Speech is clear and goal oriented, follows commands Normal 5/5 strength in upper and lower extremities bilaterally including dorsiflexion and plantar flexion, strong and equal grip strength Sensation normal to light and sharp touch Moves extremities without ataxia, coordination intact Normal gait and balance No Clonus  The patient  does have some decreased sensation to the lateral aspect of his left calf.  He does have full sensation above the left knee.,  Right lower extremity with normal sensation.  Patient cannot distinguish sharp and dull.  Patient with normal reflexes.  DP pulses are 2+ bilaterally.  Strength is normal. Skin: Skin is warm and dry. No rash noted. Pt is not diaphoretic. No erythema. With significant ecchymosis to the inner gluteal folds.  No open wounds. Psychiatric: Normal mood and affect.  Nursing note and vitals reviewed.     ED Treatments / Results  Labs (all labs ordered are listed, but only abnormal results are displayed) Labs Reviewed - No data to display  EKG  EKG Interpretation None       Radiology Dg Thoracic Spine 2 View  Result Date: 06/06/2017 CLINICAL DATA:  Initial evaluation for acute trauma, fall. EXAM: THORACIC SPINE 2 VIEWS COMPARISON:  None. FINDINGS: Mild dextroscoliosis. Vertebral bodies  otherwise normally aligned with preservation of the normal thoracic kyphosis. Vertebral body heights maintained. No acute fracture or malalignment. Mild multilevel degenerative intervertebral disc space narrowing within the mid and lower thoracic spine. Visualized heart and lungs grossly unremarkable. IMPRESSION: No radiographic evidence for acute traumatic injury within the thoracic spine. Electronically Signed   By: Rise MuBenjamin  McClintock M.D.   On: 06/06/2017 21:20   Dg Lumbar Spine Complete  Result Date: 06/06/2017 CLINICAL DATA:  Initial evaluation for acute trauma, fall. Low back pain. EXAM: LUMBAR SPINE - COMPLETE 4+ VIEW COMPARISON:  Prior CT from 05/16/2017. FINDINGS: Five non rib-bearing lumbar type vertebral bodies present. Vertebral bodies normally aligned with preservation of the normal lumbar lordosis. Vertebral body heights maintained. No acute fracture or malalignment. Mild multilevel degenerative intervertebral disc space narrowing, most notable of L4-5. Visualized sacrum intact. No acute soft tissue abnormality. IMPRESSION: No radiographic evidence for acute traumatic injury within the lumbar spine. Electronically Signed   By: Rise MuBenjamin  McClintock M.D.   On: 06/06/2017 21:18   Ct Thoracic Spine Wo Contrast  Result Date: 06/06/2017 CLINICAL DATA:  Fall down stairs EXAM: CT THORACIC AND LUMBAR SPINE WITHOUT CONTRAST TECHNIQUE: Multidetector CT imaging of the thoracic and lumbar spine was performed without contrast. Multiplanar CT image reconstructions were also generated. COMPARISON:  1. Lumbar spine radiographs 06/06/2017 2. CT abdomen pelvis 05/16/2017 FINDINGS: CT THORACIC SPINE FINDINGS Alignment: Normal Vertebrae: No acute fracture or focal pathologic process. Paraspinal and other soft tissues: Negative. Disc levels: There is moderate spinal canal stenosis at C6-C7 and at T11-T12. CT LUMBAR SPINE FINDINGS Segmentation: 5 lumbar type vertebrae. Alignment: Normal. Vertebrae: No acute  fracture or focal pathologic process. Paraspinal and other soft tissues: Negative. Disc levels: There is diffuse congenital narrowing of the spinal canal with superimposed facet hypertrophy and small disc bulges cause mild-to-moderate spinal canal stenosis throughout most of the lumbar spine. IMPRESSION: CT THORACIC SPINE IMPRESSION 1. No acute fracture or static subluxation of the thoracic spine. 2. Moderate spinal canal stenosis at C6-7 and T11-12. CT LUMBAR SPINE IMPRESSION 1. No acute fracture or static subluxation of the lumbar spine. 2. 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 Superimposed degenerative disc and facet changes cause mild-to-moderate spinal canal stenosis the mass the late the lumbar spine. This could be further evaluated MRI on an as-needed basis he. Electronically Signed   By: Deatra RobinsonKevin  Herman M.D.   On: 06/06/2017 22:47   Ct Lumbar Spine Wo Contrast  Result Date: 06/06/2017 CLINICAL DATA:  Fall down stairs EXAM:  CT THORACIC AND LUMBAR SPINE WITHOUT CONTRAST TECHNIQUE: Multidetector CT imaging of the thoracic and lumbar spine was performed without contrast. Multiplanar CT image reconstructions were also generated. COMPARISON:  1. Lumbar spine radiographs 06/06/2017 2. CT abdomen pelvis 05/16/2017 FINDINGS: CT THORACIC SPINE FINDINGS Alignment: Normal Vertebrae: No acute fracture or focal pathologic process. Paraspinal and other soft tissues: Negative. Disc levels: There is moderate spinal canal stenosis at C6-C7 and at T11-T12. CT LUMBAR SPINE FINDINGS Segmentation: 5 lumbar type vertebrae. Alignment: Normal. Vertebrae: No acute fracture or focal pathologic process. Paraspinal and other soft tissues: Negative. Disc levels: There is diffuse congenital narrowing of the spinal canal with superimposed facet hypertrophy and small disc bulges cause mild-to-moderate spinal canal stenosis throughout most of the lumbar spine. IMPRESSION: CT THORACIC SPINE  IMPRESSION 1. No acute fracture or static subluxation of the thoracic spine. 2. Moderate spinal canal stenosis at C6-7 and T11-12. CT LUMBAR SPINE IMPRESSION 1. No acute fracture or static subluxation of the lumbar spine. 2. 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 Superimposed degenerative disc and facet changes cause mild-to-moderate spinal canal stenosis the mass the late the lumbar spine. This could be further evaluated MRI on an as-needed basis he. Electronically Signed   By: Deatra Robinson M.D.   On: 06/06/2017 22:47   Dg Hips Bilat W Or Wo Pelvis 2 Views  Result Date: 06/06/2017 CLINICAL DATA:  Initial evaluation for acute trauma, fall. EXAM: DG HIP (WITH OR WITHOUT PELVIS) 2V BILAT COMPARISON:  None. FINDINGS: No acute fracture dislocation. Femoral heads in normal alignment within the acetabula. Femoral head heights maintained. Bony pelvis intact. SI joints approximated. No pubic diastasis. Moderate osteoarthritic changes about the hips bilaterally. No soft tissue abnormality. IMPRESSION: 1. No acute osseous abnormality about the hips or pelvis. 2. Moderate degenerative osteoarthrosis about the hips bilaterally. Electronically Signed   By: Rise Mu M.D.   On: 06/06/2017 21:16   Dg Femur Min 2 Views Left  Result Date: 06/06/2017 CLINICAL DATA:  Initial evaluation for acute trauma, fall. EXAM: LEFT FEMUR 2 VIEWS COMPARISON:  None. FINDINGS: No acute fracture dislocation. R osteoarthritic changes noted about the hip. No soft tissue abnormality. IMPRESSION: No acute osseous abnormality about the left femur. Electronically Signed   By: Rise Mu M.D.   On: 06/06/2017 21:21    Procedures Procedures (including critical care time)  Medications Ordered in ED Medications  oxyCODONE-acetaminophen (PERCOCET/ROXICET) 5-325 MG per tablet 1 tablet (1 tablet Oral Given 06/06/17 2020)  ibuprofen (ADVIL,MOTRIN) tablet 600 mg (600 mg Oral  Given 06/06/17 2020)     Initial Impression / Assessment and Plan / ED Course  I have reviewed the triage vital signs and the nursing notes.  Pertinent labs & imaging results that were available during my care of the patient were reviewed by me and considered in my medical decision making (see chart for details).     Patient presents to the ED for evaluation of low back pain and buttocks pain after mechanical fall prior to arrival.  Patient denies any head injury or LOC.  Patient does report some loose stool since the event.  Denies any loss of bowel or bladder.  Patient states that he has urinated since the event but denies urinating the past 2 hours.  The patient is neurovascularly intact.  Patient does have some decreased sensation to the lateral aspect of the left lower leg.  However he has normal reflexes.  Patient strength is normal in  the lower extremities.  Patient with significant bruising to his gluteal folds.  Patient does have some midline T-spine or L-spine tenderness.  He has no focal abdominal tenderness to palpation.  Bladder scan was performed that shows the patient is not retaining any urine.  I did offer rectal exam however patient refused.  X-rays were obtained that showed no acute abnormalities.  This is likely a contusion.  However given the sensation concern for possible occult fracture.  However patient does have a history of chronic back problems with herniated disc.  This could be chronic in nature.  Given that patient has normal reflexes, strength and is not having any urinary retention low suspicion for cauda equina.  Patient again refused the rectal exam.  Did discuss that this would help with my exam however he does not want it.  I did discuss with my attending concerning patient who evaluated the patient as well.  Felt like we can order CT scans of thoracic and lumbar spine.  No occult fractures were noted of the patient can be discharged with outpatient follow-up.  CT  scan returned that showed no acute findings.  Does note chronic changes that appears stable for patient.  Patient's pain has improved in the ED.  When I reassess patient he is lying on his back at this time.  He remains neurovascularly intact.  Discussed with patient that he needs symptomatic treatment at home.  I also encouraged him to follow-up with neurosurgery if his symptoms persist.  Discussed the patient to return to the ED if he develops any urinary retention, worsening numbness or paresthesias, abdominal pain.   Pt is hemodynamically stable, in NAD, & able to ambulate in the ED. Evaluation does not show pathology that would require ongoing emergent intervention or inpatient treatment. I explained the diagnosis to the patient. Pain has been managed & has no complaints prior to dc. Pt is comfortable with above plan and is stable for discharge at this time. All questions were answered prior to disposition. Strict return precautions for f/u to the ED were discussed. Encouraged follow up with PCP.  Pt seen and eval by my attending who is agreeable with the above plan.   Final Clinical Impressions(s) / ED Diagnoses   Final diagnoses:  Fall, initial encounter  Acute bilateral low back pain without sciatica    ED Discharge Orders        Ordered    traMADol (ULTRAM) 50 MG tablet  Every 6 hours PRN     06/06/17 2351       Rise Mu, PA-C 06/07/17 0106    Maia Plan, MD 06/07/17 1225

## 2017-11-28 ENCOUNTER — Emergency Department (HOSPITAL_BASED_OUTPATIENT_CLINIC_OR_DEPARTMENT_OTHER): Payer: Self-pay

## 2017-11-28 ENCOUNTER — Emergency Department (HOSPITAL_BASED_OUTPATIENT_CLINIC_OR_DEPARTMENT_OTHER)
Admission: EM | Admit: 2017-11-28 | Discharge: 2017-11-28 | Disposition: A | Discharge status: Home / Self Care | Payer: Self-pay | Attending: Emergency Medicine | Admitting: Emergency Medicine

## 2017-11-28 ENCOUNTER — Encounter (HOSPITAL_BASED_OUTPATIENT_CLINIC_OR_DEPARTMENT_OTHER): Payer: Self-pay | Admitting: Emergency Medicine

## 2017-11-28 ENCOUNTER — Other Ambulatory Visit: Payer: Self-pay

## 2017-11-28 DIAGNOSIS — M25561 Pain in right knee: Secondary | ICD-10-CM | POA: Insufficient documentation

## 2017-11-28 MED ORDER — OXYCODONE-ACETAMINOPHEN 5-325 MG PO TABS
1.0000 | ORAL_TABLET | Freq: Once | ORAL | Status: AC
  Administered 2017-11-28: 1 via ORAL
  Filled 2017-11-28: qty 1

## 2017-11-28 NOTE — Discharge Instructions (Addendum)
The x-ray of your knee was reassuring.  Please take 800 mg ibuprofen every 6 hours for pain.  He can also take Tylenol every 6 hours in addition to ibuprofen.  Ice the knee for 15 minutes at a time twice a day.  Use the crutches for comfort.  Elevate the knee when you can.  I have listed the information below to the orthopedic doctor.  Please call to schedule an appointment if your symptoms are not improving.  Return to the emergency department if you have any new or concerning symptoms like redness and swelling in the joint, fever.

## 2017-11-28 NOTE — ED Notes (Signed)
Pt opted for ACE wrap instead of knee immobilizer.

## 2017-11-28 NOTE — ED Provider Notes (Signed)
[facility] EMERGENCY DEPARTMENT Provider Note   CSN: [id number] Arrival date & time: 11/28/17  1912     History   Chief Complaint Chief Complaint  Patient presents with  . Knee Pain    HPI Ricky Buckley is a 37 y.o. male.  HPI   Patient is a 37yo male with no significant past medical history who presents to the emergency department for evaluation of right knee pain.  Patient reports that yesterday evening he was getting out of a chair and noticed that his right knee buckled beneath him.  He immediately fell backwards into his chair.  States that he has had knee pain ever since.  Pain is located generally over the knee joint and is described as severe and sharp.  Pain is constant, but acutely worsened with weightbearing or with knee flexion/extension.  He is tried taking ibuprofen and applying ice without significant improvement.  He is also reporting some numbness and tingling in his right toes which is intermittent.  Denies inciting injury, but states that he did have an injury a few weeks ago where he was told he may have injured his meniscus and was told to follow up but never ended up doing so.  He denies fevers, chills, joint swelling or redness, weakness, arthralgias elsewhere. Denies history of DVT/PE, calf swelling or tenderness, active cancer, recent surgery or immobilization. He is able to ambulate independently, although painful.  Past Medical History:  Diagnosis Date  . Ureterolithiasis     Patient Active Problem List   Diagnosis Date Noted  . Closed displaced fracture of distal phalanx of right thumb 02/10/2017  . Anterior shoulder dislocation, left, initial encounter 07/16/2016    Past Surgical History:  Procedure Laterality Date  . CYSTOSCOPY/RETROGRADE/URETEROSCOPY/STONE EXTRACTION WITH BASKET Left 07/26/2016   Procedure: CYSTOSCOPY/LEFT RETROGRADE/LEFT URETEROSCOPY/STONE EXTRACTION WITH BASKET, LEFT URETERAL STENT PLACEMENT;  Surgeon: Marcine MatarStephen Dahlstedt,  MD;  Location: [facility];  Service: Urology;  Laterality: Left;  . HOLMIUM LASER APPLICATION Left 07/26/2016   Procedure: HOLMIUM LASER APPLICATION;  Surgeon: Marcine MatarStephen Dahlstedt, MD;  Location: [facility];  Service: Urology;  Laterality: Left;        Home Medications    Prior to Admission medications   Medication Sig Start Date End Date Taking? Authorizing Provider  HYDROcodone-acetaminophen (NORCO/VICODIN) 5-325 MG tablet Take 1 tablet by mouth every 6 (six) hours as needed for moderate pain. 03/17/17   Lawyer, Cristal Deerhristopher, PA-C  ibuprofen (ADVIL,MOTRIN) 800 MG tablet Take 1 tablet (800 mg total) by mouth every 8 (eight) hours as needed. 03/17/17   Lawyer, Cristal Deerhristopher, PA-C  oxyCODONE-acetaminophen (PERCOCET/ROXICET) 5-325 MG tablet Take 1-2 tablets by mouth every 6 (six) hours as needed for severe pain. Patient not taking: Reported on 02/10/2017 01/03/17   Emi HolesLaw, Alexandra M, PA-C  traMADol (ULTRAM) 50 MG tablet Take 1 tablet (50 mg total) by mouth every 6 (six) hours as needed. 06/06/17   Rise MuLeaphart, Kenneth T, PA-C    Family History History reviewed. No pertinent family history.  Social History Social History   Tobacco Use  . Smoking status: Never Smoker  . Smokeless tobacco: Never Used  Substance Use Topics  . Alcohol use: No  . Drug use: No     Allergies   Penicillins   Review of Systems Review of Systems  Constitutional: Negative for chills and fever.  Cardiovascular: Negative for leg swelling.  Musculoskeletal: Positive for arthralgias (right knee) and gait problem (painful). Negative for joint swelling.  Skin: Negative for color change and wound.  Neurological: Positive for numbness (toes on the right, intermittent). Negative for weakness.     Physical Exam Updated Vital Signs BP (!) 139/96 (BP Location: Right Arm)   Pulse 74   Temp 98.8 F (37.1 C) (Oral)   Resp 20   Ht 6\' 3"  (1.905 m)   Wt (!) 167.8 kg (370 lb)   SpO2 100%   BMI 46.25 kg/m   Physical Exam    Constitutional: He is oriented to person, place, and time. He appears well-developed and well-nourished. No distress.  Sitting at bedside in no apparent distress.  HENT:  Head: Normocephalic and atraumatic.  Eyes: Right eye exhibits no discharge. Left eye exhibits no discharge.  Pulmonary/Chest: Effort normal. No respiratory distress.  Musculoskeletal:  Right knee generally tender over the patella as well as medial and lateral aspect of the joint. No joint effusion or swelling appreciated. No bruising, erythema or warmth overlying the joint. Full active flexion/extension, although painful. No abnormal alignment or patellar mobility. No varus/valgus laxity. Negative drawer's, no crepitus.  2+ DP pulses bilaterally. All compartments are soft. No calf tenderness or swelling. Sensation intact distal to injury.  Neurological: He is alert and oriented to person, place, and time. Coordination normal.  Skin: Skin is warm and dry. Capillary refill takes less than 2 seconds. He is not diaphoretic.  Psychiatric: He has a normal mood and affect. His behavior is normal.  Nursing note and vitals reviewed.    ED Treatments / Results  Labs (all labs ordered are listed, but only abnormal results are displayed) Labs Reviewed - No data to display  EKG None  Radiology Dg Knee Complete 4 Views Right  Result Date: 11/28/2017 CLINICAL DATA:  RIGHT knee pain EXAM: RIGHT KNEE - COMPLETE 4+ VIEW COMPARISON:  None. FINDINGS: No fracture of the proximal tibia or distal femur. Patella is normal. No joint effusion. IMPRESSION: No fracture or dislocation. Electronically Signed   By: Genevive Bi M.D.   On: 11/28/2017 19:50    Procedures Procedures (including critical care time)  Medications Ordered in ED Medications  oxyCODONE-acetaminophen (PERCOCET/ROXICET) 5-325 MG per tablet 1 tablet (has no administration in time range)     Initial Impression / Assessment and Plan / ED Course  I have reviewed the  triage vital signs and the nursing notes.  Pertinent labs & imaging results that were available during my care of the patient were reviewed by me and considered in my medical decision making (see chart for details).     Presents with generalized right knee pain. No erythema, warmth, effusion or signs of infection. RLE neurovascularly intact. Xray right knee without acute fracture or abnormality. No calf swelling or DVT risk factors. Patient will be discharged with knee immobilizer and crutches for comfort. Discussed RICE protocol and NSAIDs/Tylenol for pain. Have given him information to follow up with orthopedics and discussed reasons to return to the ED. Patient agrees and voices understanding to this plan and has no complaints prior to discharge.   Final Clinical Impressions(s) / ED Diagnoses   Final diagnoses:  Acute pain of right knee    ED Discharge Orders    None       Lawrence Marseilles 11/28/17 2209    Vanetta Mulders, MD 11/29/17 [phone]

## 2017-11-28 NOTE — ED Triage Notes (Signed)
Patient states that last night he got out of the chair and his right knee buckled and fell. The patient reports that he fell again earlier today and now it painful to walk on

## 2017-12-18 IMAGING — CR DG KNEE COMPLETE 4+V*R*
6 series · 6 of 6 positions shown · non-contrast
Comparison: None.

CLINICAL DATA: Knee pain after fall.

EXAM:
RIGHT KNEE - COMPLETE 4+ VIEW

[t knee ap right]
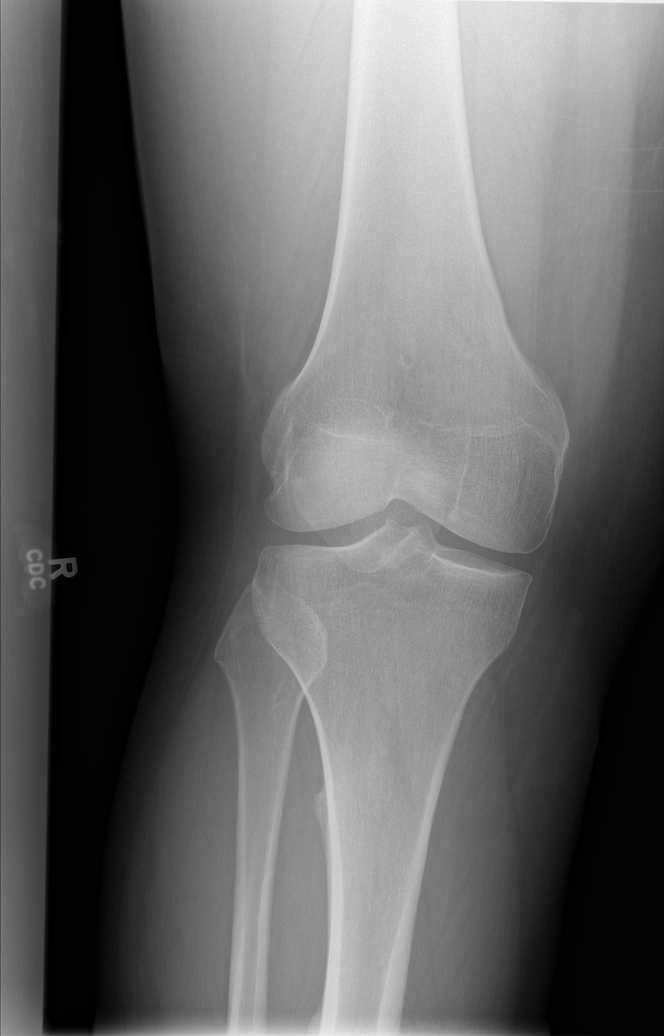

[t knee oblique right (1 of 3)]
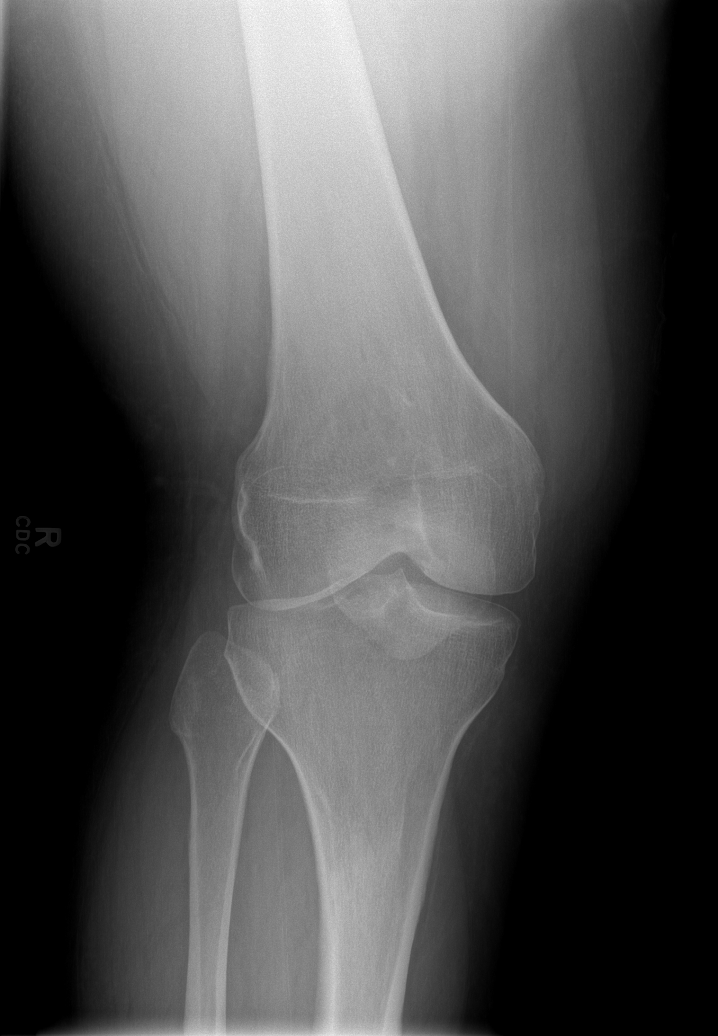

[t knee oblique right (2 of 3)]
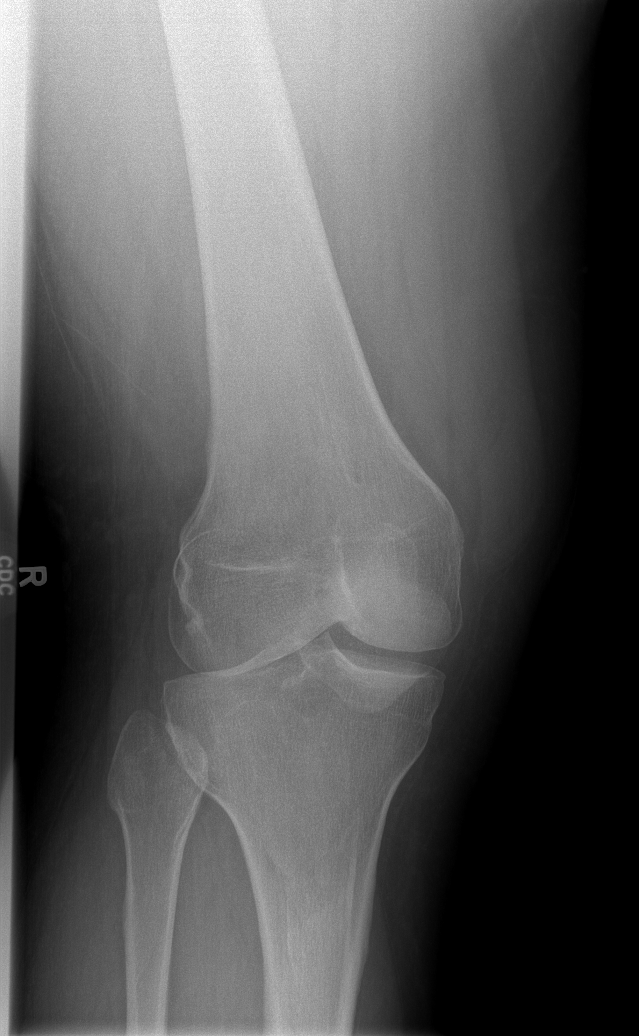

[t knee oblique right (3 of 3)]
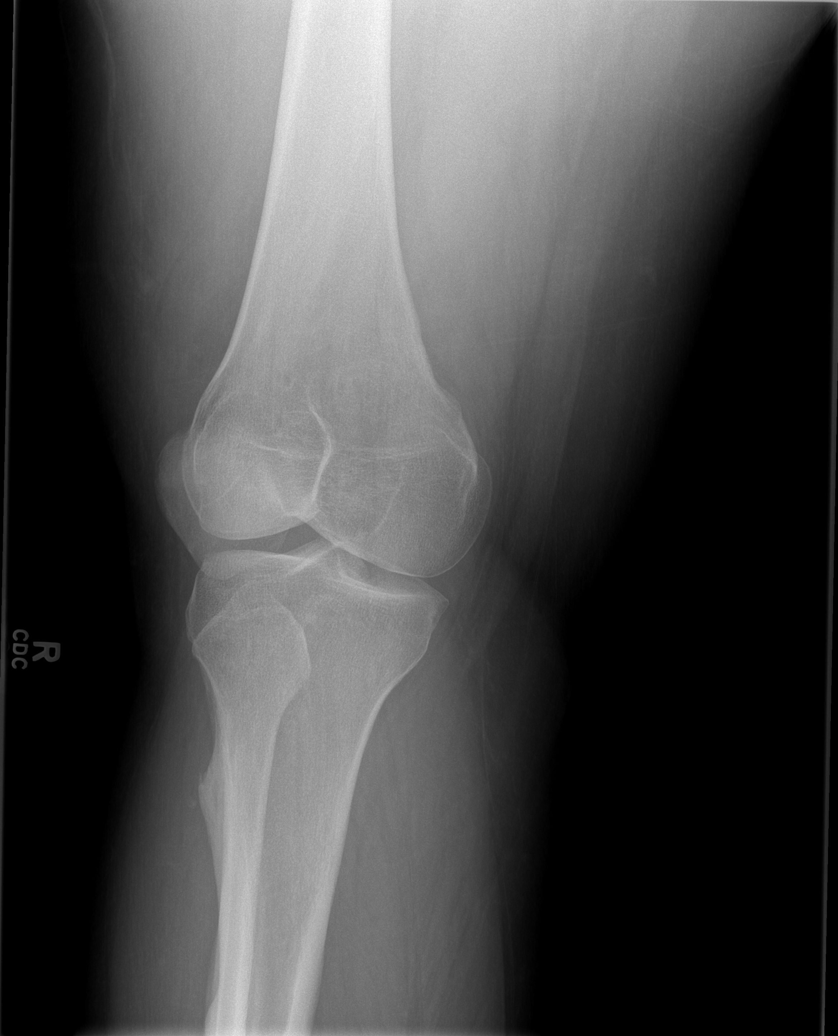

[t knee lat right (1 of 2)]
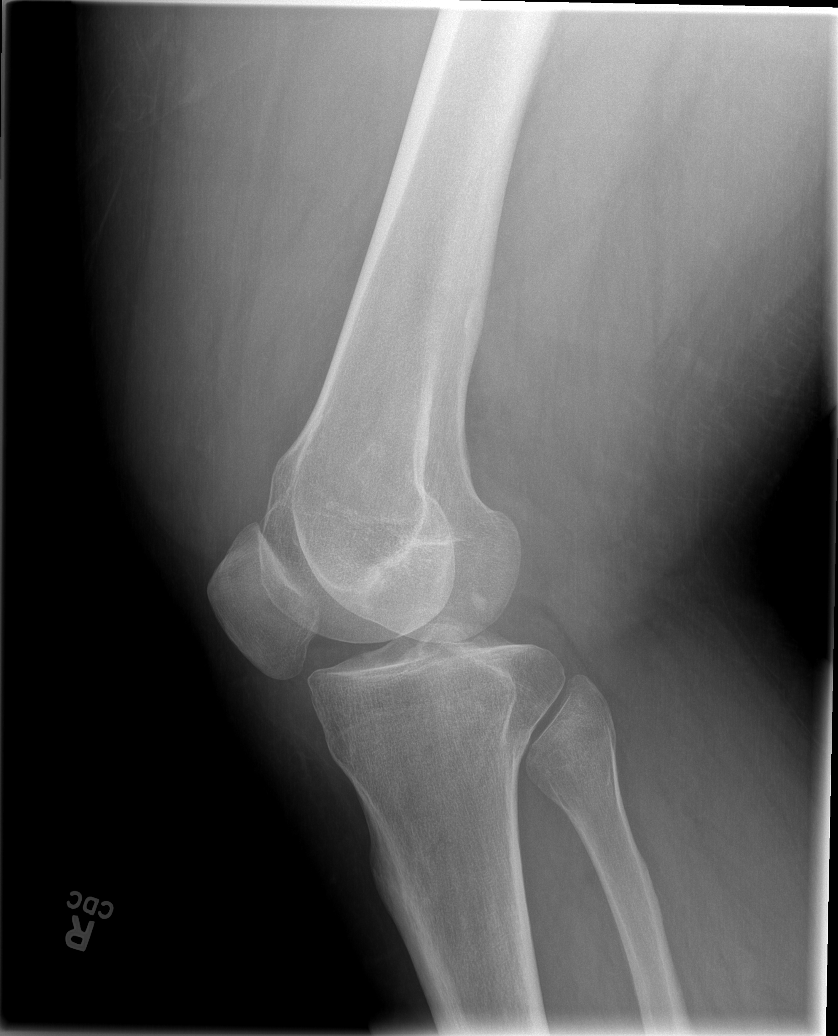

[t knee lat right (2 of 2)]
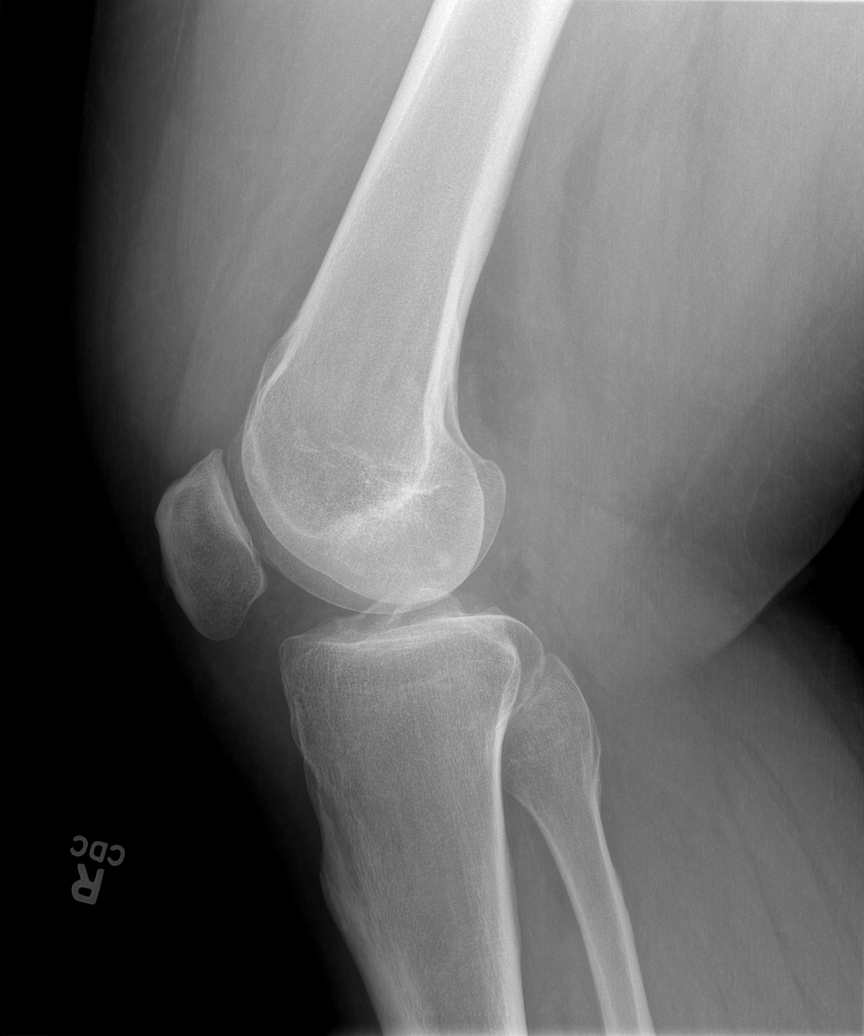

[6 of 6 positions shown; findings below may reference images not displayed]

FINDINGS: No evidence of fracture, dislocation, or joint effusion. No evidence
of arthropathy or other focal bone abnormality. Soft tissues are
unremarkable.
IMPRESSION: Negative.

## 2019-02-16 ENCOUNTER — Emergency Department (HOSPITAL_BASED_OUTPATIENT_CLINIC_OR_DEPARTMENT_OTHER)
Admission: EM | Admit: 2019-02-16 | Discharge: 2019-02-16 | Disposition: A | Discharge status: Home / Self Care | Payer: Self-pay | Attending: Emergency Medicine | Admitting: Emergency Medicine

## 2019-02-16 ENCOUNTER — Emergency Department (HOSPITAL_BASED_OUTPATIENT_CLINIC_OR_DEPARTMENT_OTHER): Payer: Self-pay

## 2019-02-16 ENCOUNTER — Encounter (HOSPITAL_BASED_OUTPATIENT_CLINIC_OR_DEPARTMENT_OTHER): Payer: Self-pay

## 2019-02-16 ENCOUNTER — Other Ambulatory Visit: Payer: Self-pay

## 2019-02-16 DIAGNOSIS — Y92008 Other place in unspecified non-institutional (private) residence as the place of occurrence of the external cause: Secondary | ICD-10-CM | POA: Insufficient documentation

## 2019-02-16 DIAGNOSIS — S62525A Nondisplaced fracture of distal phalanx of left thumb, initial encounter for closed fracture: Secondary | ICD-10-CM | POA: Insufficient documentation

## 2019-02-16 DIAGNOSIS — Y998 Other external cause status: Secondary | ICD-10-CM | POA: Insufficient documentation

## 2019-02-16 DIAGNOSIS — W270XXA Contact with workbench tool, initial encounter: Secondary | ICD-10-CM | POA: Insufficient documentation

## 2019-02-16 DIAGNOSIS — Y9389 Activity, other specified: Secondary | ICD-10-CM | POA: Insufficient documentation

## 2019-02-16 DIAGNOSIS — S62522A Displaced fracture of distal phalanx of left thumb, initial encounter for closed fracture: Secondary | ICD-10-CM

## 2019-02-16 MED ORDER — HYDROCODONE-ACETAMINOPHEN 5-325 MG PO TABS
1.0000 | ORAL_TABLET | Freq: Once | ORAL | Status: AC
  Administered 2019-02-16: 17:00:00 1 via ORAL
  Filled 2019-02-16: qty 1

## 2019-02-16 MED ORDER — ONDANSETRON 4 MG PO TBDP
4.0000 mg | ORAL_TABLET | Freq: Once | ORAL | Status: AC
  Administered 2019-02-16: 16:00:00 4 mg via ORAL
  Filled 2019-02-16: qty 1

## 2019-02-16 MED ORDER — LIDOCAINE HCL 2 % IJ SOLN
10.0000 mL | Freq: Once | INTRAMUSCULAR | Status: AC
  Administered 2019-02-16: 16:00:00 200 mg
  Filled 2019-02-16: qty 20

## 2019-02-16 NOTE — Discharge Instructions (Signed)
Wear the splint for comfort and protection.  Make sure you are elevating your thumb and using ice.

## 2019-02-16 NOTE — ED Provider Notes (Signed)
[facility] EMERGENCY DEPARTMENT Provider Note   CSN: [id number] Arrival date & time: 02/16/19  1546     History   Chief Complaint Chief Complaint  Patient presents with  . Finger Injury    HPI Ricky Buckley is a 38 y.o. male.     The history is provided by the patient.  Hand Pain This is a new problem. The current episode started 1 to 2 hours ago. The problem occurs constantly. The problem has been gradually worsening. Associated symptoms comments: Pt was outside working on his deck and his board slipped and he grabbed it with the left hand and accidentally hit his left thumb with the hammer.. The symptoms are aggravated by bending. Nothing relieves the symptoms. He has tried rest for the symptoms. The treatment provided no relief.    Past Medical History:  Diagnosis Date  . Ureterolithiasis     Patient Active Problem List   Diagnosis Date Noted  . Closed displaced fracture of distal phalanx of right thumb 02/10/2017  . Anterior shoulder dislocation, left, initial encounter 07/16/2016    Past Surgical History:  Procedure Laterality Date  . CYSTOSCOPY/RETROGRADE/URETEROSCOPY/STONE EXTRACTION WITH BASKET Left 07/26/2016   Procedure: CYSTOSCOPY/LEFT RETROGRADE/LEFT URETEROSCOPY/STONE EXTRACTION WITH BASKET, LEFT URETERAL STENT PLACEMENT;  Surgeon: Franchot Gallo, MD;  Location: [facility];  Service: Urology;  Laterality: Left;  . HOLMIUM LASER APPLICATION Left 3/47/4259   Procedure: HOLMIUM LASER APPLICATION;  Surgeon: Franchot Gallo, MD;  Location: [facility];  Service: Urology;  Laterality: Left;        Home Medications    Prior to Admission medications   Medication Sig Start Date End Date Taking? Authorizing Provider  HYDROcodone-acetaminophen (NORCO/VICODIN) 5-325 MG tablet Take 1 tablet by mouth every 6 (six) hours as needed for moderate pain. 03/17/17   Lawyer, Harrell Gave, PA-C  ibuprofen (ADVIL,MOTRIN) 800 MG tablet Take 1 tablet (800 mg total) by mouth  every 8 (eight) hours as needed. 03/17/17   Lawyer, Harrell Gave, PA-C  oxyCODONE-acetaminophen (PERCOCET/ROXICET) 5-325 MG tablet Take 1-2 tablets by mouth every 6 (six) hours as needed for severe pain. Patient not taking: Reported on 02/10/2017 01/03/17   Frederica Kuster, PA-C  traMADol (ULTRAM) 50 MG tablet Take 1 tablet (50 mg total) by mouth every 6 (six) hours as needed. 06/06/17   Doristine Devoid, PA-C    Family History No family history on file.  Social History Social History   Tobacco Use  . Smoking status: Never Smoker  . Smokeless tobacco: Never Used  Substance Use Topics  . Alcohol use: No  . Drug use: No     Allergies   Penicillins   Review of Systems Review of Systems  All other systems reviewed and are negative.    Physical Exam Updated Vital Signs BP 123/81 (BP Location: Right Arm)   Pulse 93   Resp 20   Ht 6\' 3"  (1.905 m)   Wt (!) 147.4 kg   SpO2 99%   BMI 40.62 kg/m   Physical Exam Vitals signs and nursing note reviewed.  Constitutional:      General: He is not in acute distress.    Appearance: He is obese.  HENT:     Head: Normocephalic.  Eyes:     Pupils: Pupils are equal, round, and reactive to light.  Cardiovascular:     Rate and Rhythm: Normal rate.     Pulses: Normal pulses.  Pulmonary:     Effort: Pulmonary effort is normal.  Musculoskeletal:  Hands:  Skin:    General: Skin is warm.     Capillary Refill: Capillary refill takes less than 2 seconds.  Neurological:     General: No focal deficit present.     Mental Status: He is alert and oriented to person, place, and time. Mental status is at baseline.  Psychiatric:        Mood and Affect: Mood normal.        Behavior: Behavior normal.      ED Treatments / Results  Labs (all labs ordered are listed, but only abnormal results are displayed) Labs Reviewed - No data to display  EKG None  Radiology Dg Finger Thumb Left  Result Date: 02/16/2019 CLINICAL DATA:   Chest left thumb pain after hitting the thumb with a hammer 1.5 hours ago. EXAM: LEFT THUMB 2+V COMPARISON:  None. FINDINGS: Small, nondisplaced fracture in the 1st distal tuft. Otherwise, unremarkable. IMPRESSION: Nondisplaced 1st distal tuft fracture. Electronically Signed   By: Beckie SaltsSteven  Reid M.D.   On: 02/16/2019 16:11    Procedures Procedures (including critical care time)  Medications Ordered in ED Medications  ondansetron (ZOFRAN-ODT) disintegrating tablet 4 mg (has no administration in time range)  lidocaine (XYLOCAINE) 2 % (with pres) injection 200 mg (200 mg Infiltration Given by Other 02/16/19 1620)     Initial Impression / Assessment and Plan / ED Course  I have reviewed the triage vital signs and the nursing notes.  Pertinent labs & imaging results that were available during my care of the patient were reviewed by me and considered in my medical decision making (see chart for details).        Patient presenting today after hitting his left thumb with a hammer.  No subungual hematoma present at this time.  Patient refusing to bend at the PIP joint but states it is related to pain.  X-ray shows a nondisplaced first distal tuft fracture but no other complicating features.  Digital block performed for pain control.  No significant relief after digital block.  Patient given an oral dose of pain medication and placed in a splint.  Final Clinical Impressions(s) / ED Diagnoses   Final diagnoses:  Closed fracture of tuft of distal phalanx of left thumb    ED Discharge Orders    None       Gwyneth SproutPlunkett, [place] Welcome, MD 02/16/19 1718

## 2019-02-16 NOTE — ED Triage Notes (Signed)
Pt states he hit left thumb with a hammer ~1.5 hours ago-no break in skin noted-NAD-steady gait

## 2019-09-23 ENCOUNTER — Other Ambulatory Visit: Payer: Self-pay

## 2019-09-23 ENCOUNTER — Emergency Department (HOSPITAL_BASED_OUTPATIENT_CLINIC_OR_DEPARTMENT_OTHER)
Admission: EM | Admit: 2019-09-23 | Discharge: 2019-09-23 | Disposition: A | Discharge status: Home / Self Care | Payer: Self-pay | Attending: Emergency Medicine | Admitting: Emergency Medicine

## 2019-09-23 ENCOUNTER — Encounter (HOSPITAL_BASED_OUTPATIENT_CLINIC_OR_DEPARTMENT_OTHER): Payer: Self-pay | Admitting: Emergency Medicine

## 2019-09-23 ENCOUNTER — Emergency Department (HOSPITAL_BASED_OUTPATIENT_CLINIC_OR_DEPARTMENT_OTHER): Payer: Self-pay

## 2019-09-23 DIAGNOSIS — M25562 Pain in left knee: Secondary | ICD-10-CM | POA: Insufficient documentation

## 2019-09-23 MED ORDER — CELECOXIB 200 MG PO CAPS: 200.0000 mg | ORAL_CAPSULE | Freq: Two times a day (BID) | ORAL | 0 refills | Status: AC | PRN

## 2019-09-23 MED ORDER — KETOROLAC TROMETHAMINE 60 MG/2ML IM SOLN
60.0000 mg | Freq: Once | INTRAMUSCULAR | Status: AC
  Administered 2019-09-23: 11:00:00 60 mg via INTRAMUSCULAR
  Filled 2019-09-23: qty 2

## 2019-09-23 MED ORDER — HYDROCODONE-ACETAMINOPHEN 5-325 MG PO TABS: 1.0000 | ORAL_TABLET | Freq: Four times a day (QID) | ORAL | 0 refills | Status: DC | PRN

## 2019-09-23 NOTE — ED Provider Notes (Signed)
[facility] EMERGENCY DEPARTMENT Provider Note   CSN: [id number] Arrival date & time: 09/23/19  0941     History Chief Complaint  Patient presents with  . Knee Pain    Ricky Buckley is a 39 y.o. male with no relevant past medical history presents to the ED after sustaining an injury to his left knee.  Patient reports that yesterday he was climbing down a ladder when he missed a rung, fell backwards, and had his left leg trapped between the rungs.  He was hanging upside down for a few minutes until his neighbor was able to assist him.  Since then, he has been having ambulatory difficulty and instability.  Patient feels as though his left knee is "giving out".  He complains of 9 out of 10 pain involving medial aspect of his left knee as well as proximal tibia.  He has taken NSAIDs at home, with little relief.  He denies any other injury, anticoagulants, clotting disorder, open wound, numbness or weakness, significant swelling, overlying skin changes, or other symptoms.  HPI     Past Medical History:  Diagnosis Date  . Ureterolithiasis     Patient Active Problem List   Diagnosis Date Noted  . Closed displaced fracture of distal phalanx of right thumb 02/10/2017  . Anterior shoulder dislocation, left, initial encounter 07/16/2016    Past Surgical History:  Procedure Laterality Date  . CYSTOSCOPY/RETROGRADE/URETEROSCOPY/STONE EXTRACTION WITH BASKET Left 07/26/2016   Procedure: CYSTOSCOPY/LEFT RETROGRADE/LEFT URETEROSCOPY/STONE EXTRACTION WITH BASKET, LEFT URETERAL STENT PLACEMENT;  Surgeon: Franchot Gallo, MD;  Location: [facility];  Service: Urology;  Laterality: Left;  . HOLMIUM LASER APPLICATION Left 9/76/7341   Procedure: HOLMIUM LASER APPLICATION;  Surgeon: Franchot Gallo, MD;  Location: [facility];  Service: Urology;  Laterality: Left;       No family history on file.  Social History   Tobacco Use  . Smoking status: Never Smoker  . Smokeless tobacco: Never Used    Substance Use Topics  . Alcohol use: No  . Drug use: No    Home Medications Prior to Admission medications   Medication Sig Start Date End Date Taking? Authorizing Provider  celecoxib (CELEBREX) 200 MG capsule Take 1 capsule (200 mg total) by mouth 2 (two) times daily between meals as needed for up to 14 days for moderate pain. 09/23/19 10/07/19  Corena Herter, PA-C  HYDROcodone-acetaminophen (NORCO/VICODIN) 5-325 MG tablet Take 1 tablet by mouth every 6 (six) hours as needed for up to 4 doses for severe pain. 09/23/19   Corena Herter, PA-C  ibuprofen (ADVIL,MOTRIN) 800 MG tablet Take 1 tablet (800 mg total) by mouth every 8 (eight) hours as needed. 03/17/17   Lawyer, Harrell Gave, PA-C  oxyCODONE-acetaminophen (PERCOCET/ROXICET) 5-325 MG tablet Take 1-2 tablets by mouth every 6 (six) hours as needed for severe pain. Patient not taking: Reported on 02/10/2017 01/03/17   Frederica Kuster, PA-C  traMADol (ULTRAM) 50 MG tablet Take 1 tablet (50 mg total) by mouth every 6 (six) hours as needed. 06/06/17   Doristine Devoid, PA-C    Allergies    Penicillins  Review of Systems   Review of Systems  Musculoskeletal: Positive for arthralgias and gait problem. Negative for joint swelling.  Skin: Negative for color change and wound.  Neurological: Negative for weakness and numbness.  Hematological: Does not bruise/bleed easily.    Physical Exam Updated Vital Signs BP 133/84   Pulse 64   Temp 98.2 F (36.8 C) (Oral)   Resp  20   Ht 6\' 3"  (1.905 m)   Wt (!) 156.5 kg   SpO2 97%   BMI 43.12 kg/m   Physical Exam Vitals and nursing note reviewed. Exam conducted with a chaperone present.  Constitutional:      Appearance: Normal appearance.  HENT:     Head: Normocephalic and atraumatic.  Eyes:     General: No scleral icterus.    Conjunctiva/sclera: Conjunctivae normal.  Cardiovascular:     Rate and Rhythm: Normal rate and regular rhythm.     Pulses: Normal pulses.     Heart sounds:  Normal heart sounds.  Pulmonary:     Effort: Pulmonary effort is normal.  Musculoskeletal:     Cervical back: Normal range of motion.     Comments: Left knee: No significant swelling.  No overlying skin changes.  Popliteal pulse intact.  Tenderness diffusely, most notably over medial aspect and proximal tibia.  ROM and strength limited due to discomfort.  Negative varus and valgus stress testing.   Left hip: Normal.  ROM and strength against resistance intact. Left ankle: Mildly diminished strength with flexion and dorsiflexion, patient attributes to discomfort.  Pedal pulse and cap refill intact. Soft compartments throughout.  Skin:    General: Skin is dry.     Capillary Refill: Capillary refill takes less than 2 seconds.  Neurological:     Mental Status: He is alert.     GCS: GCS eye subscore is 4. GCS verbal subscore is 5. GCS motor subscore is 6.  Psychiatric:        Mood and Affect: Mood normal.        Behavior: Behavior normal.        Thought Content: Thought content normal.     ED Results / Procedures / Treatments   Labs (all labs ordered are listed, but only abnormal results are displayed) Labs Reviewed - No data to display  EKG None  Radiology DG Knee Complete 4 Views Left  Result Date: 09/23/2019 CLINICAL DATA:  Pain following fall EXAM: LEFT KNEE - COMPLETE 4+ VIEW COMPARISON:  None. FINDINGS: Frontal, lateral, and bilateral oblique views were obtained. No evident fracture or dislocation. No joint effusion. Joint spaces appear normal. No erosive change. IMPRESSION: No fracture, dislocation, or joint effusion. No evident arthropathic change. Electronically Signed   By: 11/23/2019 III M.D.   On: 09/23/2019 10:31    Procedures Procedures (including critical care time)  Medications Ordered in ED Medications  ketorolac (TORADOL) injection 60 mg (60 mg Intramuscular Given 09/23/19 1031)    ED Course  I have reviewed the triage vital signs and the nursing  notes.  Pertinent labs & imaging results that were available during my care of the patient were reviewed by me and considered in my medical decision making (see chart for details).    MDM Rules/Calculators/A&P                      DG left knee complete 4+ view demonstrates no evident fracture, dislocation, effusion, or other acute osseous abnormality.  Given patient's significant pain and concerning mechanism of injury, suspect ligamentous or tendinous injury.  Will refer to orthopedics for ongoing evaluation and management.  He may benefit from MRI for further evaluation.  Administer IM Toradol here in the ED which brought his pain from a 9 out of 10 to a 7 out of 10.  In the meantime, will place in a knee brace.  Will encourage that he  continue using NSAIDs as needed for his symptoms of discomfort.  He can also take Tylenol for his pain.  Will prescribe a few pills of Vicodin for his breakthrough pain.  Patient was able to ambulate, albeit with some difficulty and instability.  Low suspicion for compartment syndrome, arterial or venous clots, or other acute or emergent pathology.  Discussed case with Dr. Particia Nearing who saw patient and agrees with assessment and plan.  Strict ED return precautions discussed.  All of the evaluation and work-up results were discussed with the patient and any family at bedside. They were provided opportunity to ask any additional questions and have none at this time. They have expressed understanding of verbal discharge instructions as well as return precautions and are agreeable to the plan.   Final Clinical Impression(s) / ED Diagnoses Final diagnoses:  Acute pain of left knee    Rx / DC Orders ED Discharge Orders         Ordered    HYDROcodone-acetaminophen (NORCO/VICODIN) 5-325 MG tablet  Every 6 hours PRN     09/23/19 1125    celecoxib (CELEBREX) 200 MG capsule  2 times daily between meals PRN     09/23/19 1125           Elvera Maria 09/23/19 1126    Jacalyn Lefevre, MD 09/23/19 1421

## 2019-09-23 NOTE — Discharge Instructions (Addendum)
Please call the office of Dr. Carola Frost to schedule an appointment for ongoing evaluation and management.  You may require further work-up including MRI to evaluate for ligamentous injury.  Please take your medications, as prescribed.  Do not drive, drink, or operate machinery after taking Vicodin.  Return to the ED or seek any medical attention should you experience new or worsening symptoms.

## 2019-09-23 NOTE — ED Triage Notes (Signed)
Pt c/o pain to LT knee; sts LLE went through the rung of a ladder and he ended up hanging upside down as he was coming down it yesterday; his neighbor got him down with a forklift-type machine

## 2019-09-23 NOTE — ED Notes (Signed)
Pt has crutches at home advised RN and PA

## 2019-09-30 ENCOUNTER — Emergency Department (HOSPITAL_BASED_OUTPATIENT_CLINIC_OR_DEPARTMENT_OTHER): Payer: Self-pay

## 2019-09-30 ENCOUNTER — Emergency Department (HOSPITAL_BASED_OUTPATIENT_CLINIC_OR_DEPARTMENT_OTHER)
Admission: EM | Admit: 2019-09-30 | Discharge: 2019-09-30 | Disposition: A | Discharge status: Home / Self Care | Payer: Self-pay | Attending: Emergency Medicine | Admitting: Emergency Medicine

## 2019-09-30 ENCOUNTER — Encounter (HOSPITAL_BASED_OUTPATIENT_CLINIC_OR_DEPARTMENT_OTHER): Payer: Self-pay | Admitting: *Deleted

## 2019-09-30 ENCOUNTER — Other Ambulatory Visit: Payer: Self-pay

## 2019-09-30 DIAGNOSIS — J189 Pneumonia, unspecified organism: Secondary | ICD-10-CM | POA: Insufficient documentation

## 2019-09-30 DIAGNOSIS — Z880 Allergy status to penicillin: Secondary | ICD-10-CM | POA: Insufficient documentation

## 2019-09-30 DIAGNOSIS — Z20822 Contact with and (suspected) exposure to covid-19: Secondary | ICD-10-CM | POA: Insufficient documentation

## 2019-09-30 LAB — URINALYSIS, MICROSCOPIC (REFLEX)

## 2019-09-30 LAB — COMPREHENSIVE METABOLIC PANEL
ALT: 27 U/L (ref 0–44)
AST: 22 U/L (ref 15–41)
Albumin: 3.3 g/dL — ABNORMAL LOW (ref 3.5–5.0)
Alkaline Phosphatase: 65 U/L (ref 38–126)
Anion gap: 10 (ref 5–15)
BUN: 13 mg/dL (ref 6–20)
CO2: 25 mmol/L (ref 22–32)
Calcium: 8.4 mg/dL — ABNORMAL LOW (ref 8.9–10.3)
Chloride: 102 mmol/L (ref 98–111)
Creatinine, Ser: 0.89 mg/dL (ref 0.61–1.24)
GFR calc Af Amer: >60 mL/min (ref >60)
GFR calc non Af Amer: >60 mL/min (ref >60)
Glucose, Bld: 104 mg/dL — ABNORMAL HIGH (ref 70–99)
Potassium: 3.7 mmol/L (ref 3.5–5.1)
Sodium: 137 mmol/L (ref 135–145)
Total Bilirubin: 1 mg/dL (ref 0.3–1.2)
Total Protein: 7.8 g/dL (ref 6.5–8.1)

## 2019-09-30 LAB — CBC WITH DIFFERENTIAL/PLATELET
Abs Immature Granulocytes: 0.78 10*3/uL — ABNORMAL HIGH (ref 0.00–0.07)
Basophils Absolute: 0.2 10*3/uL — ABNORMAL HIGH (ref 0.0–0.1)
Basophils Relative: 1 %
Eosinophils Absolute: 0.3 10*3/uL (ref 0.0–0.5)
Eosinophils Relative: 2 %
HCT: 45 % (ref 39.0–52.0)
Hemoglobin: 14.5 g/dL (ref 13.0–17.0)
Immature Granulocytes: 5 %
Lymphocytes Relative: 17 %
Lymphs Abs: 2.7 10*3/uL (ref 0.7–4.0)
MCH: 28.2 pg (ref 26.0–34.0)
MCHC: 32.2 g/dL (ref 30.0–36.0)
MCV: 87.5 fL (ref 80.0–100.0)
Monocytes Absolute: 1.4 10*3/uL — ABNORMAL HIGH (ref 0.1–1.0)
Monocytes Relative: 8 %
Neutro Abs: 11 10*3/uL — ABNORMAL HIGH (ref 1.7–7.7)
Neutrophils Relative %: 67 %
Platelets: 439 10*3/uL — ABNORMAL HIGH (ref 150–400)
RBC: 5.14 MIL/uL (ref 4.22–5.81)
RDW: 12.8 % (ref 11.5–15.5)
WBC: 16.3 10*3/uL — ABNORMAL HIGH (ref 4.0–10.5)
nRBC: 0 % (ref 0.0–0.2)

## 2019-09-30 LAB — URINALYSIS, ROUTINE W REFLEX MICROSCOPIC
Bilirubin Urine: NEGATIVE
Glucose, UA: NEGATIVE mg/dL
Hgb urine dipstick: NEGATIVE
Ketones, ur: NEGATIVE mg/dL
Nitrite: NEGATIVE
Protein, ur: NEGATIVE mg/dL
Specific Gravity, Urine: 1.025 (ref 1.005–1.030)
pH: 5.5 (ref 5.0–8.0)

## 2019-09-30 LAB — SARS CORONAVIRUS 2 AG (30 MIN TAT): SARS Coronavirus 2 Ag: NEGATIVE

## 2019-09-30 MED ORDER — DOXYCYCLINE HYCLATE 100 MG PO TABS: 100.0000 mg | ORAL_TABLET | Freq: Two times a day (BID) | ORAL | 0 refills | Status: DC

## 2019-09-30 MED ORDER — DOXYCYCLINE HYCLATE 100 MG PO TABS
100.0000 mg | ORAL_TABLET | Freq: Once | ORAL | Status: AC
  Administered 2019-09-30: 100 mg via ORAL
  Filled 2019-09-30: qty 1

## 2019-09-30 MED ORDER — ONDANSETRON HCL 4 MG/2ML IJ SOLN
4.0000 mg | Freq: Once | INTRAMUSCULAR | Status: AC
  Administered 2019-09-30: 4 mg via INTRAVENOUS
  Filled 2019-09-30: qty 2

## 2019-09-30 MED ORDER — SODIUM CHLORIDE 0.9 % IV BOLUS
1000.0000 mL | Freq: Once | INTRAVENOUS | Status: AC
  Administered 2019-09-30: 1000 mL via INTRAVENOUS

## 2019-09-30 MED ORDER — ONDANSETRON 4 MG PO TBDP: 4.0000 mg | ORAL_TABLET | Freq: Three times a day (TID) | ORAL | 0 refills | Status: DC | PRN

## 2019-09-30 NOTE — ED Notes (Signed)
Pt on monitor 

## 2019-09-30 NOTE — Discharge Instructions (Signed)
Return if any problems.

## 2019-09-30 NOTE — ED Notes (Signed)
Pt aware of need for urine specimen, unable to provide at this time.  IV fluids infusing 

## 2019-09-30 NOTE — ED Triage Notes (Signed)
Fever, cough, body aches, abdominal pain, vomiting, diarrhea, sob and fatigue x 5 days.

## 2019-10-01 LAB — SARS CORONAVIRUS 2 (TAT 6-24 HRS): SARS Coronavirus 2: NEGATIVE

## 2019-10-01 NOTE — ED Provider Notes (Signed)
[facility] EMERGENCY DEPARTMENT Provider Note   CSN: [id number] Arrival date & time: 09/30/19  1334     History Chief Complaint  Patient presents with  . Abdominal Pain    Ricky Buckley is a 39 y.o. male.  The history is provided by the patient. No language interpreter was used.  Abdominal Pain Pain location:  Generalized Pain quality: aching   Pain radiates to:  Does not radiate Pain severity:  Mild Onset quality:  Gradual Duration:  1 week Timing:  Constant Progression:  Worsening Chronicity:  New Context: recent illness   Relieved by:  Nothing Worsened by:  Nothing Ineffective treatments:  None tried Associated symptoms: cough, diarrhea, nausea and vomiting   Risk factors: no alcohol abuse and has not had multiple surgeries   Pt reports vomiting and diarrhea.  Pt has had a fever and slight  cough      Past Medical History:  Diagnosis Date  . Ureterolithiasis     Patient Active Problem List   Diagnosis Date Noted  . Closed displaced fracture of distal phalanx of right thumb 02/10/2017  . Anterior shoulder dislocation, left, initial encounter 07/16/2016    Past Surgical History:  Procedure Laterality Date  . CYSTOSCOPY/RETROGRADE/URETEROSCOPY/STONE EXTRACTION WITH BASKET Left 07/26/2016   Procedure: CYSTOSCOPY/LEFT RETROGRADE/LEFT URETEROSCOPY/STONE EXTRACTION WITH BASKET, LEFT URETERAL STENT PLACEMENT;  Surgeon: Marcine Matar, MD;  Location: [facility];  Service: Urology;  Laterality: Left;  . HOLMIUM LASER APPLICATION Left 07/26/2016   Procedure: HOLMIUM LASER APPLICATION;  Surgeon: Marcine Matar, MD;  Location: [facility];  Service: Urology;  Laterality: Left;       No family history on file.  Social History   Tobacco Use  . Smoking status: Never Smoker  . Smokeless tobacco: Never Used  Substance Use Topics  . Alcohol use: No  . Drug use: No    Home Medications Prior to Admission medications   Medication Sig Start Date End Date  Taking? Authorizing Provider  celecoxib (CELEBREX) 200 MG capsule Take 1 capsule (200 mg total) by mouth 2 (two) times daily between meals as needed for up to 14 days for moderate pain. 09/23/19 10/07/19  Lorelee New, PA-C  doxycycline (VIBRA-TABS) 100 MG tablet Take 1 tablet (100 mg total) by mouth 2 (two) times daily. 09/30/19   Elson Areas, PA-C  HYDROcodone-acetaminophen (NORCO/VICODIN) 5-325 MG tablet Take 1 tablet by mouth every 6 (six) hours as needed for up to 4 doses for severe pain. 09/23/19   Lorelee New, PA-C  ibuprofen (ADVIL,MOTRIN) 800 MG tablet Take 1 tablet (800 mg total) by mouth every 8 (eight) hours as needed. 03/17/17   Lawyer, Cristal Deer, PA-C  ondansetron (ZOFRAN ODT) 4 MG disintegrating tablet Take 1 tablet (4 mg total) by mouth every 8 (eight) hours as needed for nausea or vomiting. 09/30/19   Elson Areas, PA-C  oxyCODONE-acetaminophen (PERCOCET/ROXICET) 5-325 MG tablet Take 1-2 tablets by mouth every 6 (six) hours as needed for severe pain. Patient not taking: Reported on 02/10/2017 01/03/17   Emi Holes, PA-C  traMADol (ULTRAM) 50 MG tablet Take 1 tablet (50 mg total) by mouth every 6 (six) hours as needed. 06/06/17   Rise Mu, PA-C    Allergies    Penicillins  Review of Systems   Review of Systems  Respiratory: Positive for cough.   Gastrointestinal: Positive for abdominal pain, diarrhea, nausea and vomiting.  All other systems reviewed and are negative.   Physical Exam Updated Vital Signs BP Marland Kitchen)  136/92   Pulse 66   Temp 98.3 F (36.8 C) (Oral)   Resp 10   Ht 6\' 3"  (1.905 m)   Wt (!) 156.5 kg   SpO2 97%   BMI 43.12 kg/m   Physical Exam Vitals and nursing note reviewed.  Constitutional:      Appearance: He is well-developed.  HENT:     Head: Normocephalic and atraumatic.  Eyes:     Conjunctiva/sclera: Conjunctivae normal.  Cardiovascular:     Rate and Rhythm: Normal rate and regular rhythm.     Heart sounds: No murmur.    Pulmonary:     Effort: Pulmonary effort is normal. No respiratory distress.     Breath sounds: Normal breath sounds.  Abdominal:     General: Abdomen is flat. Bowel sounds are normal. There is distension.     Palpations: Abdomen is soft.     Tenderness: There is no abdominal tenderness.  Musculoskeletal:     Cervical back: Neck supple.  Skin:    General: Skin is warm and dry.  Neurological:     General: No focal deficit present.     Mental Status: He is alert.  Psychiatric:        Mood and Affect: Mood normal.     ED Results / Procedures / Treatments   Labs (all labs ordered are listed, but only abnormal results are displayed) Labs Reviewed  CBC WITH DIFFERENTIAL/PLATELET - Abnormal; Notable for the following components:      Result Value   WBC 16.3 (*)    Platelets 439 (*)    Neutro Abs 11.0 (*)    Monocytes Absolute 1.4 (*)    Basophils Absolute 0.2 (*)    Abs Immature Granulocytes 0.78 (*)    All other components within normal limits  COMPREHENSIVE METABOLIC PANEL - Abnormal; Notable for the following components:   Glucose, Bld 104 (*)    Calcium 8.4 (*)    Albumin 3.3 (*)    All other components within normal limits  URINALYSIS, ROUTINE W REFLEX MICROSCOPIC - Abnormal; Notable for the following components:   Leukocytes,Ua TRACE (*)    All other components within normal limits  URINALYSIS, MICROSCOPIC (REFLEX) - Abnormal; Notable for the following components:   Bacteria, UA FEW (*)    All other components within normal limits  SARS CORONAVIRUS 2 AG (30 MIN TAT)  SARS CORONAVIRUS 2 (TAT 6-24 HRS)    EKG None  Radiology DG Chest Portable 1 View  Result Date: 09/30/2019 CLINICAL DATA:  Fever, cough, myalgias, shortness of breath and fatigue the past 5 days. Abdominal pain and vomiting with diarrhea. EXAM: PORTABLE CHEST 1 VIEW COMPARISON:  None. FINDINGS: Enlarged cardiac silhouette. Mild patchy opacity in both lungs. No pleural fluid. Unremarkable bones.  IMPRESSION: Cardiomegaly and mild bilateral pneumonia. Electronically Signed   By: 10/02/2019 M.D.   On: 09/30/2019 17:10    Procedures Procedures (including critical care time)  Medications Ordered in ED Medications  sodium chloride 0.9 % bolus 1,000 mL (0 mLs Intravenous Stopped 09/30/19 1649)  ondansetron (ZOFRAN) injection 4 mg (4 mg Intravenous Given 09/30/19 1549)  doxycycline (VIBRA-TABS) tablet 100 mg (100 mg Oral Given 09/30/19 2001)    ED Course  I have reviewed the triage vital signs and the nursing notes.  Pertinent labs & imaging results that were available during my care of the patient were reviewed by me and considered in my medical decision making (see chart for details).    MDM Rules/Calculators/A&P  MDM: rapid covid is negative,  Pt given IV fluids x 1liter.  Wbc count is elevated,  Chest xray shows bilat pneumonia.  Probable viral pneumonia,  Pt given rx for zofran and doxycycline.   Final Clinical Impression(s) / ED Diagnoses Final diagnoses:  Community acquired pneumonia, unspecified laterality    Rx / DC Orders ED Discharge Orders         Ordered    doxycycline (VIBRA-TABS) 100 MG tablet  2 times daily     09/30/19 1951    ondansetron (ZOFRAN ODT) 4 MG disintegrating tablet  Every 8 hours PRN,   Status:  Discontinued     09/30/19 1958    ondansetron (ZOFRAN ODT) 4 MG disintegrating tablet  Every 8 hours PRN     09/30/19 2000        An After Visit Summary was printed and given to the patient.    Fransico Meadow, [place] 10/01/19 0950    Truddie Hidden, MD 10/02/19 [phone]

## 2019-10-03 MED FILL — ONDANSETRON ODT 4 MG TABLET: 4 | 4 days supply | Qty: 20 | Fill #0

## 2019-12-09 ENCOUNTER — Emergency Department (HOSPITAL_BASED_OUTPATIENT_CLINIC_OR_DEPARTMENT_OTHER)
Admission: EM | Admit: 2019-12-09 | Discharge: 2019-12-09 | Disposition: A | Discharge status: Home / Self Care | Payer: Self-pay | Attending: Emergency Medicine | Admitting: Emergency Medicine

## 2019-12-09 ENCOUNTER — Encounter (HOSPITAL_BASED_OUTPATIENT_CLINIC_OR_DEPARTMENT_OTHER): Payer: Self-pay | Admitting: *Deleted

## 2019-12-09 ENCOUNTER — Other Ambulatory Visit: Payer: Self-pay

## 2019-12-09 ENCOUNTER — Emergency Department (HOSPITAL_BASED_OUTPATIENT_CLINIC_OR_DEPARTMENT_OTHER): Payer: Self-pay

## 2019-12-09 DIAGNOSIS — M25561 Pain in right knee: Secondary | ICD-10-CM | POA: Insufficient documentation

## 2019-12-09 DIAGNOSIS — M25562 Pain in left knee: Secondary | ICD-10-CM | POA: Insufficient documentation

## 2019-12-09 MED ORDER — NAPROXEN 500 MG PO TABS: 500.0000 mg | ORAL_TABLET | Freq: Two times a day (BID) | ORAL | 0 refills | Status: DC

## 2019-12-09 MED ORDER — KETOROLAC TROMETHAMINE 30 MG/ML IJ SOLN
30.0000 mg | Freq: Once | INTRAMUSCULAR | Status: AC
  Administered 2019-12-09: 30 mg via INTRAMUSCULAR
  Filled 2019-12-09: qty 1

## 2019-12-09 NOTE — ED Provider Notes (Signed)
[facility] EMERGENCY DEPARTMENT Provider Note   CSN: [id number] Arrival date & time: 12/09/19  1405     History Chief Complaint  Patient presents with  . Knee Pain    Ricky Buckley is a 39 y.o. male with no significant past medical history presents to the ED due to bilateral knee pain that has progressively worsened over the past 2 weeks.  Patient states his right knee is bothering him due to overcompensating due to his left knee pain.  Chart reviewed.  Patient was seen on 09/23/2019 after a left knee injury and referred to an orthopedic surgeon for a possible MRI.  Patient notes he does not have insurance and has been unable to go to the orthopedic surgeon.  Patient states that the pain improved after his injury back in April however returned 2 weeks ago.  Patient notes pain is worse with extension of his left knee.  Pain is worse on the lateral aspect.  Denies recent injury. Denies fever and chills.  Admits to intermittent edema, but denies erythema and warmth.  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  He has been taking Bayer aspirin with moderate relief.  History obtained from patient and past medical records. No interpreter used during encounter.      Past Medical History:  Diagnosis Date  . Ureterolithiasis     Patient Active Problem List   Diagnosis Date Noted  . Closed displaced fracture of distal phalanx of right thumb 02/10/2017  . Anterior shoulder dislocation, left, initial encounter 07/16/2016    Past Surgical History:  Procedure Laterality Date  . CYSTOSCOPY/RETROGRADE/URETEROSCOPY/STONE EXTRACTION WITH BASKET Left 07/26/2016   Procedure: CYSTOSCOPY/LEFT RETROGRADE/LEFT URETEROSCOPY/STONE EXTRACTION WITH BASKET, LEFT URETERAL STENT PLACEMENT;  Surgeon: Marcine Matar, MD;  Location: [facility];  Service: Urology;  Laterality: Left;  . HOLMIUM LASER APPLICATION Left 07/26/2016   Procedure: HOLMIUM LASER APPLICATION;  Surgeon:  Marcine Matar, MD;  Location: [facility];  Service: Urology;  Laterality: Left;       No family history on file.  Social History   Tobacco Use  . Smoking status: Never Smoker  . Smokeless tobacco: Never Used  Vaping Use  . Vaping Use: Never used  Substance Use Topics  . Alcohol use: No  . Drug use: No    Home Medications Prior to Admission medications   Medication Sig Start Date End Date Taking? Authorizing Provider  doxycycline (VIBRA-TABS) 100 MG tablet Take 1 tablet (100 mg total) by mouth 2 (two) times daily. 09/30/19   Elson Areas, PA-C  HYDROcodone-acetaminophen (NORCO/VICODIN) 5-325 MG tablet Take 1 tablet by mouth every 6 (six) hours as needed for up to 4 doses for severe pain. 09/23/19   Lorelee New, PA-C  ibuprofen (ADVIL,MOTRIN) 800 MG tablet Take 1 tablet (800 mg total) by mouth every 8 (eight) hours as needed. 03/17/17   Lawyer, Cristal Deer, PA-C  naproxen (NAPROSYN) 500 MG tablet Take 1 tablet (500 mg total) by mouth 2 (two) times daily. 12/09/19   Mannie Stabile, PA-C  ondansetron (ZOFRAN ODT) 4 MG disintegrating tablet Take 1 tablet (4 mg total) by mouth every 8 (eight) hours as needed for nausea or vomiting. 09/30/19   Elson Areas, PA-C  oxyCODONE-acetaminophen (PERCOCET/ROXICET) 5-325 MG tablet Take 1-2 tablets by mouth every 6 (six) hours as needed for severe pain. Patient not taking: Reported on 02/10/2017 01/03/17   Emi Holes, PA-C  traMADol (ULTRAM) 50 MG tablet Take 1 tablet (  50 mg total) by mouth every 6 (six) hours as needed. 06/06/17   Doristine Devoid, PA-C    Allergies    Penicillins  Review of Systems   Review of Systems  Constitutional: Negative for chills and fever.  Musculoskeletal: Positive for arthralgias, gait problem and joint swelling. Negative for back pain.  Neurological: Negative for numbness.  All other systems reviewed and are negative.   Physical Exam Updated Vital Signs BP (!) 132/91   Pulse 84   Temp 99.3  F (37.4 C) (Oral)   Resp 20   Ht 6\' 3"  (1.905 m)   Wt (!) 172.4 kg   SpO2 97%   BMI 47.50 kg/m   Physical Exam Vitals and nursing note reviewed.  Constitutional:      General: He is not in acute distress.    Appearance: He is not ill-appearing.  HENT:     Head: Normocephalic.  Eyes:     Pupils: Pupils are equal, round, and reactive to light.  Cardiovascular:     Rate and Rhythm: Normal rate and regular rhythm.     Pulses: Normal pulses.     Heart sounds: Normal heart sounds. No murmur heard.  No friction rub. No gallop.   Pulmonary:     Effort: Pulmonary effort is normal.     Breath sounds: Normal breath sounds.  Abdominal:     General: Abdomen is flat. There is no distension.     Palpations: Abdomen is soft.     Tenderness: There is no abdominal tenderness. There is no guarding or rebound.  Musculoskeletal:     Cervical back: Neck supple.     Comments: No tenderness throughout left knee.  Limited extension due to pain.  Full flexion.  Patient ambulates with a limp here in the ED.  No overlying erythema, edema, or warmth.  Distal pulses and sensation intact.  Bilateral nonpitting edema of the lower extremities.  No tenderness throughout right knee.  Soft compartments bilaterally.  Skin:    General: Skin is warm and dry.  Neurological:     General: No focal deficit present.     Mental Status: He is alert.  Psychiatric:        Mood and Affect: Mood normal.        Behavior: Behavior normal.     ED Results / Procedures / Treatments   Labs (all labs ordered are listed, but only abnormal results are displayed) Labs Reviewed - No data to display  EKG None  Radiology DG Knee Complete 4 Views Left  Result Date: 12/09/2019 CLINICAL DATA:  Knee pain since falling from a ladder 2 months ago. EXAM: LEFT KNEE - COMPLETE 4+ VIEW COMPARISON:  Radiographs 09/23/2019. FINDINGS: The mineralization and alignment are normal. There is no evidence of acute fracture or dislocation.  The joint spaces are preserved. Possible small joint effusion without focal soft tissue abnormality or foreign body. IMPRESSION: No acute osseous findings or significant arthropathic changes. Possible small joint effusion. Electronically Signed   By: Richardean Sale M.D.   On: 12/09/2019 15:39    Procedures Procedures (including critical care time)  Medications Ordered in ED Medications  ketorolac (TORADOL) 30 MG/ML injection 30 mg (30 mg Intramuscular Given 12/09/19 1543)    ED Course  I have reviewed the triage vital signs and the nursing notes.  Pertinent labs & imaging results that were available during my care of the patient were reviewed by me and considered in my medical decision making (see chart  for details).    MDM Rules/Calculators/A&P                         39 year old male presents to the ED due to 2 weeks of left knee pain.  Patient had a left knee injury on 09/23/2019 and was referred to an orthopedic surgeon however has been unable to go due to insurance issues.  Patient denies fever, chills, overlying erythema or warmth.  Upon arrival, patient afebrile, not tachycardic or hypoxic.  Blood pressure mildly elevated 132/91 likely due to pain.  Physical exam reassuring.  No tenderness throughout left or right knee.  Bilateral lower extremities neurovascularly intact with soft compartments.  Patient able to ambulate in the ED with a mild limp.  No overlying erythema, edema, or warmth to suggest septic arthritis. Low suspicion for DVT. Will obtain x-ray to rule out any acute abnormalities and treat pain with IM Toradol.  Patient will most likely need an MRI in the outpatient setting to rule out a torn ligament.  X-ray personally reviewed which demonstrates:   IMPRESSION:  No acute osseous findings or significant arthropathic changes.  Possible small joint effusion.   Patient placed in knee sleeve. Patient deferred crutches. Orthopedic number given to patient at discharge.   Instructed patient to call tomorrow to schedule plan for further evaluation.  Will discharge patient with naproxen as needed for pain management.  RICE discussed with patient. Strict ED precautions discussed with patient. Patient states understanding and agrees to plan. Patient discharged home in no acute distress and stable vitals  Final Clinical Impression(s) / ED Diagnoses Final diagnoses:  Acute pain of left knee    Rx / DC Orders ED Discharge Orders         Ordered    naproxen (NAPROSYN) 500 MG tablet  2 times daily     Discontinue  Reprint     12/09/19 1539           Jesusita Oka 12/09/19 1610    Little, Ambrose Finland, MD 12/10/19 1657

## 2019-12-09 NOTE — ED Notes (Signed)
Pt. Refusing crutches, he states" he is too uncoordinated to use them", will pickup a cane from [employer] instead

## 2019-12-09 NOTE — ED Triage Notes (Signed)
Pain in both knees, worse in the left.

## 2019-12-09 NOTE — Discharge Instructions (Addendum)
As discussed, your x-ray was negative for any broken bones.  It did show a little bit of extra fluid in the joint.  Keep the knee brace on as needed for comfort.  You may purchase a cane over-the-counter at any pharmacy or [facility].  You may also purchase over-the-counter Lidoderm patches and Voltaren gel for added pain relief.  I am sending home with naproxen.  Take as prescribed and as needed for pain.  I have included the number of the orthopedic surgeon.  Please call tomorrow to schedule appointment for further evaluation.  Return to the ER for new or worsening symptoms.

## 2019-12-12 ENCOUNTER — Telehealth: Payer: Self-pay | Admitting: Family Medicine

## 2019-12-12 NOTE — Telephone Encounter (Signed)
Not sure what I am supposed to do with this?

## 2019-12-12 NOTE — Telephone Encounter (Signed)
Called patient 1x to sent appt. Voicemail full.

## 2019-12-13 ENCOUNTER — Encounter: Payer: Self-pay | Admitting: Family Medicine

## 2019-12-13 ENCOUNTER — Other Ambulatory Visit: Payer: Self-pay

## 2019-12-13 ENCOUNTER — Ambulatory Visit (INDEPENDENT_AMBULATORY_CARE_PROVIDER_SITE_OTHER): Payer: Self-pay | Admitting: Family Medicine

## 2019-12-13 ENCOUNTER — Other Ambulatory Visit: Payer: Self-pay | Admitting: Family Medicine

## 2019-12-13 DIAGNOSIS — M2352 Chronic instability of knee, left knee: Secondary | ICD-10-CM

## 2019-12-13 MED ORDER — MELOXICAM 15 MG PO TABS: 7.5000 mg | ORAL_TABLET | Freq: Every day | ORAL | 6 refills | Status: DC | PRN

## 2019-12-13 NOTE — Progress Notes (Signed)
Office Visit Note   Patient: Ricky Buckley           Date of Birth: 1981-02-07           MRN: [id number] Visit Date: 12/13/2019 Requested by: Lavinia Sharps, NP [address] [place],  [place] [address] PCP: Lavinia Sharps, NP  Subjective: Chief Complaint  Patient presents with  . Left Knee - Pain    Injured knee in April (2021): slipped while coming down from a ladder, left foot went through the rungs while he was falling, was then hanging by his knee. Knee gives way. Has no "push power." Swelling.    HPI: He is here for left knee pain and instability.  2 months ago he was up on a ladder, slipped and fell and as he slid down the ladder his left leg got caught in one of the rungs and he was hanging from the ladder.  He had to be helped by a friend to get out of that position.  He had immediate severe pain in his knee and went to the hospital where x-rays were negative for acute fracture.  He was referred to another orthopedic group but they would not see him since he had no insurance.  Eventually his pain improved, but a couple weeks ago while in [place], he stepped down from a Hand his knee gave out on him causing him to fall.  Once again, he required help to get back up.  He went back to the ER where x-rays were negative for fracture.  He was given a knee brace but it was too tight.  He has pain, he cannot fully extend his knee when lying down, and it feels unstable.  He states that it has given out multiple times.  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               ROS:   All other systems were reviewed and are negative.  Objective: Vital Signs: There were no vitals taken for this visit.  Physical Exam:  General:  Alert and oriented, in no acute distress. Pulm:  Breathing unlabored. Psy:  Normal mood, congruent affect.  Left knee: 1+ effusion, no warmth or erythema.  Negative patella apprehension test, negative  patella compression test.  Lachman's is difficult due to patient body habitus.  I believe he has a little bit of laxity with anterior drawer compared to the right.  He has tenderness on the medial joint line, no pain or click with McMurray's.  No laxity with varus or valgus stress.   Imaging: None today  Assessment & Plan: 1.  Recurrent left knee instability, suspicious for ACL tear.  He has mild periarticular spurring on x-ray suggesting early arthritis. -Elected to proceed with MRI scan followed by surgical consult if indicated. -Knee brace for support. -He is currently applying for an orange card to help with medical expenses.     Procedures: No procedures performed  No notes on file     PMFS History: Patient Active Problem List   Diagnosis Date Noted  . Closed displaced fracture of distal phalanx of right thumb 02/10/2017  . Anterior shoulder dislocation, left, initial encounter 07/16/2016   Past Medical History:  Diagnosis Date  . Ureterolithiasis     History reviewed. No pertinent family history.  Past Surgical History:  Procedure Laterality Date  . CYSTOSCOPY/RETROGRADE/URETEROSCOPY/STONE EXTRACTION WITH BASKET Left 07/26/2016  Procedure: CYSTOSCOPY/LEFT RETROGRADE/LEFT URETEROSCOPY/STONE EXTRACTION WITH BASKET, LEFT URETERAL STENT PLACEMENT;  Surgeon: Marcine Matar, MD;  Location: [facility];  Service: Urology;  Laterality: Left;  . HOLMIUM LASER APPLICATION Left 07/26/2016   Procedure: HOLMIUM LASER APPLICATION;  Surgeon: Marcine Matar, MD;  Location: [facility];  Service: Urology;  Laterality: Left;   Social History   Occupational History  . Not on file  Tobacco Use  . Smoking status: Never Smoker  . Smokeless tobacco: Never Used  Vaping Use  . Vaping Use: Never used  Substance and Sexual Activity  . Alcohol use: No  . Drug use: No  . Sexual activity: Not on file

## 2019-12-22 ENCOUNTER — Telehealth: Payer: Self-pay | Admitting: *Deleted

## 2019-12-22 NOTE — Telephone Encounter (Signed)
Received email from [place] with [facility] MRI dept stating pt doesn't have any money at this time to even make partial payment on MRI. For self pay pt, the charge is 650.00 and can take either payment in full, or 1/2 or he can pay in payments for 3 months.   She advised pt he can try [facility] and see if they are able to help him out as far as billing.   I called pt and he informed me that he would rather be billed then having to pay partial because right now he is out of work and is trying to make ends meet with what he has and want to find out what is wrong with his knee. I informed him I will send his order to [facility] and they will contact him to schedule appt and once he hears from them he can ask about the billing portion. Pt voiced understanding and wants to proceed with [facility].

## 2020-01-30 ENCOUNTER — Encounter: Payer: Self-pay | Admitting: Radiology

## 2020-01-30 NOTE — Progress Notes (Signed)
Error

## 2020-05-04 ENCOUNTER — Encounter (HOSPITAL_BASED_OUTPATIENT_CLINIC_OR_DEPARTMENT_OTHER): Payer: Self-pay

## 2020-05-04 ENCOUNTER — Emergency Department (HOSPITAL_BASED_OUTPATIENT_CLINIC_OR_DEPARTMENT_OTHER)
Admission: EM | Admit: 2020-05-04 | Discharge: 2020-05-04 | Disposition: A | Discharge status: Home / Self Care | Payer: Medicaid Other | Attending: Emergency Medicine | Admitting: Emergency Medicine

## 2020-05-04 ENCOUNTER — Other Ambulatory Visit: Payer: Self-pay

## 2020-05-04 DIAGNOSIS — N201 Calculus of ureter: Secondary | ICD-10-CM | POA: Insufficient documentation

## 2020-05-04 DIAGNOSIS — R109 Unspecified abdominal pain: Secondary | ICD-10-CM

## 2020-05-04 LAB — CBC WITH DIFFERENTIAL/PLATELET
Abs Immature Granulocytes: 0.17 10*3/uL — ABNORMAL HIGH (ref 0.00–0.07)
Basophils Absolute: 0.2 10*3/uL — ABNORMAL HIGH (ref 0.0–0.1)
Basophils Relative: 1 %
Eosinophils Absolute: 0.4 10*3/uL (ref 0.0–0.5)
Eosinophils Relative: 2 %
HCT: 41.3 % (ref 39.0–52.0)
Hemoglobin: 13.4 g/dL (ref 13.0–17.0)
Immature Granulocytes: 1 %
Lymphocytes Relative: 41 %
Lymphs Abs: 7.3 10*3/uL — ABNORMAL HIGH (ref 0.7–4.0)
MCH: 27.7 pg (ref 26.0–34.0)
MCHC: 32.4 g/dL (ref 30.0–36.0)
MCV: 85.5 fL (ref 80.0–100.0)
Monocytes Absolute: 1.4 10*3/uL — ABNORMAL HIGH (ref 0.1–1.0)
Monocytes Relative: 8 %
Neutro Abs: 8.1 10*3/uL — ABNORMAL HIGH (ref 1.7–7.7)
Neutrophils Relative %: 47 %
Platelets: 436 10*3/uL — ABNORMAL HIGH (ref 150–400)
RBC: 4.83 MIL/uL (ref 4.22–5.81)
RDW: 13.7 % (ref 11.5–15.5)
Smear Review: NORMAL
WBC Morphology: ABNORMAL
WBC: 17.6 10*3/uL — ABNORMAL HIGH (ref 4.0–10.5)
nRBC: 0 % (ref 0.0–0.2)

## 2020-05-04 LAB — URINALYSIS, ROUTINE W REFLEX MICROSCOPIC
Bilirubin Urine: NEGATIVE
Glucose, UA: NEGATIVE mg/dL
Ketones, ur: NEGATIVE mg/dL
Nitrite: NEGATIVE
Protein, ur: 30 mg/dL — AB
Specific Gravity, Urine: >1.03 — ABNORMAL HIGH (ref 1.005–1.030)
pH: 5.5 (ref 5.0–8.0)

## 2020-05-04 LAB — BASIC METABOLIC PANEL
Anion gap: 9 (ref 5–15)
BUN: 12 mg/dL (ref 6–20)
CO2: 25 mmol/L (ref 22–32)
Calcium: 8.3 mg/dL — ABNORMAL LOW (ref 8.9–10.3)
Chloride: 102 mmol/L (ref 98–111)
Creatinine, Ser: 1.06 mg/dL (ref 0.61–1.24)
GFR, Estimated: >60 mL/min (ref >60)
Glucose, Bld: 139 mg/dL — ABNORMAL HIGH (ref 70–99)
Potassium: 3.5 mmol/L (ref 3.5–5.1)
Sodium: 136 mmol/L (ref 135–145)

## 2020-05-04 LAB — URINALYSIS, MICROSCOPIC (REFLEX): RBC / HPF: >50 RBC/hpf (ref 0–5)

## 2020-05-04 MED ORDER — ONDANSETRON 8 MG PO TBDP: 8.0000 mg | ORAL_TABLET | Freq: Three times a day (TID) | ORAL | 0 refills | Status: DC | PRN

## 2020-05-04 MED ORDER — KETOROLAC TROMETHAMINE 30 MG/ML IJ SOLN
30.0000 mg | Freq: Once | INTRAMUSCULAR | Status: AC
  Administered 2020-05-04: 30 mg via INTRAVENOUS
  Filled 2020-05-04: qty 1

## 2020-05-04 MED ORDER — ONDANSETRON HCL 4 MG/2ML IJ SOLN
4.0000 mg | Freq: Once | INTRAMUSCULAR | Status: AC
  Administered 2020-05-04: 4 mg via INTRAVENOUS
  Filled 2020-05-04: qty 2

## 2020-05-04 MED ORDER — FENTANYL CITRATE (PF) 100 MCG/2ML IJ SOLN
100.0000 ug | Freq: Once | INTRAMUSCULAR | Status: AC
  Administered 2020-05-04: 100 ug via INTRAVENOUS
  Filled 2020-05-04: qty 2

## 2020-05-04 MED ORDER — HYDROCODONE-ACETAMINOPHEN 5-325 MG PO TABS: 1.0000 | ORAL_TABLET | Freq: Four times a day (QID) | ORAL | 0 refills | Status: DC | PRN

## 2020-05-04 MED ORDER — HYDROMORPHONE HCL 1 MG/ML IJ SOLN
1.0000 mg | Freq: Once | INTRAMUSCULAR | Status: AC
  Administered 2020-05-04: 1 mg via INTRAVENOUS
  Filled 2020-05-04: qty 1

## 2020-05-04 NOTE — ED Provider Notes (Signed)
[facility] EMERGENCY DEPARTMENT Provider Note   CSN: [id number] Arrival date & time: 05/04/20  6295     History Chief Complaint  Patient presents with  . Flank Pain    Rt side flank pain with vomiting    Ricky Buckley is a 39 y.o. male.  The history is provided by the patient.  Flank Pain This is a new problem. The current episode started less than 1 hour ago. The problem occurs constantly. The problem has been rapidly worsening. Associated symptoms include abdominal pain. Pertinent negatives include no chest pain and no shortness of breath. Nothing aggravates the symptoms. Nothing relieves the symptoms. He has tried nothing for the symptoms.   Patient reports sudden onset of right flank pain with associated nausea vomiting.  Reports it feels similar to prior episodes of kidney stones.  It does not radiate. No fevers, no cough.  He had otherwise been at his baseline    Past Medical History:  Diagnosis Date  . Ureterolithiasis     Patient Active Problem List   Diagnosis Date Noted  . Closed displaced fracture of distal phalanx of right thumb 02/10/2017  . Anterior shoulder dislocation, left, initial encounter 07/16/2016    Past Surgical History:  Procedure Laterality Date  . CYSTOSCOPY/RETROGRADE/URETEROSCOPY/STONE EXTRACTION WITH BASKET Left 07/26/2016   Procedure: CYSTOSCOPY/LEFT RETROGRADE/LEFT URETEROSCOPY/STONE EXTRACTION WITH BASKET, LEFT URETERAL STENT PLACEMENT;  Surgeon: Marcine Matar, MD;  Location: [facility];  Service: Urology;  Laterality: Left;  . HOLMIUM LASER APPLICATION Left 07/26/2016   Procedure: HOLMIUM LASER APPLICATION;  Surgeon: Marcine Matar, MD;  Location: [facility];  Service: Urology;  Laterality: Left;       History reviewed. No pertinent family history.  Social History   Tobacco Use  . Smoking status: Never Smoker  . Smokeless tobacco: Never Used  Vaping Use  . Vaping Use: Never used  Substance Use Topics  . Alcohol use: No    . Drug use: No    Home Medications Prior to Admission medications   Medication Sig Start Date End Date Taking? Authorizing Provider  ibuprofen (ADVIL,MOTRIN) 800 MG tablet Take 1 tablet (800 mg total) by mouth every 8 (eight) hours as needed. 03/17/17   Lawyer, Cristal Deer, PA-C    Allergies    Penicillins  Review of Systems   Review of Systems  Constitutional: Negative for fever.  Respiratory: Negative for shortness of breath.   Cardiovascular: Negative for chest pain.  Gastrointestinal: Positive for abdominal pain, nausea and vomiting.  Genitourinary: Positive for flank pain. Negative for testicular pain.  All other systems reviewed and are negative.   Physical Exam Updated Vital Signs BP (!) 158/116 (BP Location: Left Arm)   Pulse 76   Temp 97.7 F (36.5 C) (Oral)   Resp (!) 22   Ht 1.88 m (6\' 2" )   Wt (!) 167.8 kg   SpO2 99%   BMI 47.51 kg/m   Physical Exam CONSTITUTIONAL: Well developed, grunting and writhing in pain HEAD: Normocephalic/atraumatic EYES: EOMI/PERRL ENMT: Mask in place NECK: supple no meningeal signs SPINE/BACK:entire spine nontender CV: S1/S2 noted, no murmurs/rubs/gallops noted LUNGS: Lungs are clear to auscultation bilaterally, no apparent distress ABDOMEN: soft, nontender, no rebound or guarding, bowel sounds noted throughout abdomen, obese GU: Right cva tenderness NEURO: Pt is awake/alert/appropriate, moves all extremitiesx4.  No facial droop.   EXTREMITIES: pulses normal/equal, full ROM SKIN: warm, color normal PSYCH: Anxious  ED Results / Procedures / Treatments   Labs (all labs ordered are listed, but only  abnormal results are displayed) Labs Reviewed  BASIC METABOLIC PANEL - Abnormal; Notable for the following components:      Result Value   Glucose, Bld 139 (*)    Calcium 8.3 (*)    All other components within normal limits  CBC WITH DIFFERENTIAL/PLATELET - Abnormal; Notable for the following components:   WBC 17.6 (*)     Platelets 436 (*)    Neutro Abs 8.1 (*)    Lymphs Abs 7.3 (*)    Monocytes Absolute 1.4 (*)    Basophils Absolute 0.2 (*)    Abs Immature Granulocytes 0.17 (*)    All other components within normal limits  URINALYSIS, ROUTINE W REFLEX MICROSCOPIC - Abnormal; Notable for the following components:   APPearance CLOUDY (*)    Specific Gravity, Urine >1.030 (*)    Hgb urine dipstick LARGE (*)    Protein, ur 30 (*)    Leukocytes,Ua TRACE (*)    All other components within normal limits  URINALYSIS, MICROSCOPIC (REFLEX) - Abnormal; Notable for the following components:   Bacteria, UA FEW (*)    All other components within normal limits  PATHOLOGIST SMEAR REVIEW    EKG None  Radiology No results found.  Procedures Procedures   Medications Ordered in ED Medications  fentaNYL (SUBLIMAZE) injection 100 mcg (100 mcg Intravenous Given 05/04/20 0340)  ketorolac (TORADOL) 30 MG/ML injection 30 mg (30 mg Intravenous Given 05/04/20 0340)  ondansetron (ZOFRAN) injection 4 mg (4 mg Intravenous Given 05/04/20 0340)  HYDROmorphone (DILAUDID) injection 1 mg (1 mg Intravenous Given 05/04/20 0410)    ED Course  I have reviewed the triage vital signs and the nursing notes.  Pertinent labs  results that were available during my care of the patient were reviewed by me and considered in my medical decision making (see chart for details).    MDM Rules/Calculators/A&P                          3:57 AM Patient presents with sudden onset of right flank pain with associated vomiting.  He reports a similar to prior to the kidney stones.  Patient appears very uncomfortable.  IV pain medicines have been ordered 5:59 AM Patient is improved.  He is taking p.o. fluids and ambulatory. Patient has long history of kidney stones and he feels this is similar to prior.  Patient is not septic appearing.  He is afebrile, no convincing signs of UTI.  He does have hematuria Patient feels comfortable for discharge  home.  Will prescribe pain medications and antiemetics.  I did advise close follow-up with urology as he may end up needing an outpatient procedure.  At this time no indication for admission We discussed return precautions Final Clinical Impression(s) / ED Diagnoses Final diagnoses:  Flank pain    Rx / DC Orders ED Discharge Orders         Ordered    HYDROcodone-acetaminophen (NORCO/VICODIN) 5-325 MG tablet  Every 6 hours PRN        05/04/20 0550    ondansetron (ZOFRAN ODT) 8 MG disintegrating tablet  Every 8 hours PRN        05/04/20 0550           Zadie Rhine, MD 05/04/20 0600

## 2020-05-04 NOTE — ED Notes (Signed)
Rt side flank pain

## 2020-05-04 NOTE — ED Notes (Signed)
Attempted IV in right AC unsuccessful.  

## 2020-05-04 NOTE — ED Notes (Signed)
Patient ambulated around the nursing station without any issues, tolerated PO fluids

## 2020-05-07 LAB — PATHOLOGIST SMEAR REVIEW

## 2020-10-25 ENCOUNTER — Other Ambulatory Visit: Payer: Self-pay

## 2020-10-25 ENCOUNTER — Emergency Department (HOSPITAL_BASED_OUTPATIENT_CLINIC_OR_DEPARTMENT_OTHER)
Admission: EM | Admit: 2020-10-25 | Discharge: 2020-10-25 | Disposition: A | Discharge status: Home / Self Care | Payer: No Typology Code available for payment source | Attending: Emergency Medicine | Admitting: Emergency Medicine

## 2020-10-25 ENCOUNTER — Encounter (HOSPITAL_BASED_OUTPATIENT_CLINIC_OR_DEPARTMENT_OTHER): Payer: Self-pay

## 2020-10-25 DIAGNOSIS — X58XXXA Exposure to other specified factors, initial encounter: Secondary | ICD-10-CM | POA: Diagnosis not present

## 2020-10-25 DIAGNOSIS — S0993XA Unspecified injury of face, initial encounter: Secondary | ICD-10-CM | POA: Insufficient documentation

## 2020-10-25 MED ORDER — OXYCODONE-ACETAMINOPHEN 5-325 MG PO TABS
1.0000 | ORAL_TABLET | ORAL | 0 refills | Status: DC | PRN
Start: 2020-10-25 — End: 2022-05-22

## 2020-10-25 MED ORDER — CLINDAMYCIN HCL 150 MG PO CAPS: 450.0000 mg | ORAL_CAPSULE | Freq: Three times a day (TID) | ORAL | 0 refills | Status: AC

## 2020-10-25 NOTE — Discharge Instructions (Signed)
Your history and exam are consistent with a tooth injury on the left upper tooth that you are scheduled to have taken care of next week anyway.  Please go to the appointment on Monday and due to the exposed bone, please take the antibiotics to prevent infection and use the pain medicines help with discomfort.  Please try to avoid very hot and very cold food items as this may worsen the discomfort.  Please rest and stay hydrated and watch for signs and symptoms of infection.  If any symptoms change acutely, please return to the nearest emergency department.

## 2020-10-25 NOTE — ED Triage Notes (Signed)
Broke front L upper tooth biting into a [employer] sandwhich PTA.  C/O pain

## 2020-10-25 NOTE — ED Provider Notes (Signed)
[facility] EMERGENCY DEPT Provider Note   CSN: [id number] Arrival date & time: 10/25/20  1608     History Chief Complaint  Patient presents with  . Dental Pain    Ricky Buckley is a 40 y.o. male.  The history is provided by the patient and medical records. No language interpreter was used.  Dental Pain Location:  Upper Upper teeth location:  10/LU lateral incisor Quality:  Aching Severity:  Severe Onset quality:  Sudden Duration:  1 hour Timing:  Constant Progression:  Unchanged Chronicity:  New Context: dental fracture   Relieved by:  Nothing Worsened by:  Touching Ineffective treatments:  None tried Associated symptoms: no congestion, no difficulty swallowing, no fever, no gum swelling, no headaches, no oral bleeding, no oral lesions and no trismus   Risk factors: periodontal disease        Past Medical History:  Diagnosis Date  . Ureterolithiasis     Patient Active Problem List   Diagnosis Date Noted  . Closed displaced fracture of distal phalanx of right thumb 02/10/2017  . Anterior shoulder dislocation, left, initial encounter 07/16/2016    Past Surgical History:  Procedure Laterality Date  . CYSTOSCOPY/RETROGRADE/URETEROSCOPY/STONE EXTRACTION WITH BASKET Left 07/26/2016   Procedure: CYSTOSCOPY/LEFT RETROGRADE/LEFT URETEROSCOPY/STONE EXTRACTION WITH BASKET, LEFT URETERAL STENT PLACEMENT;  Surgeon: Marcine Matar, MD;  Location: [facility];  Service: Urology;  Laterality: Left;  . HOLMIUM LASER APPLICATION Left 07/26/2016   Procedure: HOLMIUM LASER APPLICATION;  Surgeon: Marcine Matar, MD;  Location: [facility];  Service: Urology;  Laterality: Left;       History reviewed. No pertinent family history.  Social History   Tobacco Use  . Smoking status: Never Smoker  . Smokeless tobacco: Never Used  Vaping Use  . Vaping Use: Never used  Substance Use Topics  . Alcohol use: No  . Drug use: No    Home Medications Prior to Admission  medications   Medication Sig Start Date End Date Taking? Authorizing Provider  ibuprofen (ADVIL,MOTRIN) 800 MG tablet Take 1 tablet (800 mg total) by mouth every 8 (eight) hours as needed. 03/17/17  Yes Lawyer, Cristal Deer, PA-C  HYDROcodone-acetaminophen (NORCO/VICODIN) 5-325 MG tablet Take 1 tablet by mouth every 6 (six) hours as needed for severe pain. 05/04/20   Zadie Rhine, MD  ondansetron (ZOFRAN ODT) 8 MG disintegrating tablet Take 1 tablet (8 mg total) by mouth every 8 (eight) hours as needed. 05/04/20   Zadie Rhine, MD    Allergies    Penicillins  Review of Systems   Review of Systems  Constitutional: Negative for chills, fatigue and fever.  HENT: Positive for dental problem. Negative for congestion and mouth sores.   Respiratory: Negative for chest tightness.   Cardiovascular: Negative for chest pain.  Gastrointestinal: Negative for abdominal pain.  Genitourinary: Negative for flank pain.  Neurological: Negative for headaches.  Psychiatric/Behavioral: Negative for agitation.  All other systems reviewed and are negative.   Physical Exam Updated Vital Signs BP (!) 162/123 (BP Location: Right Arm)   Pulse 100   Temp 98.5 F (36.9 C) (Oral)   Resp 20   Ht 6\' 3"  (1.905 m)   Wt (!) 172.4 kg   SpO2 99%   BMI 47.50 kg/m   Physical Exam Vitals and nursing note reviewed.  Constitutional:      General: He is not in acute distress.    Appearance: He is well-developed. He is not ill-appearing, toxic-appearing or diaphoretic.  HENT:     Head: Normocephalic and atraumatic.  Nose: No congestion.     Mouth/Throat:     Mouth: Mucous membranes are moist. Injury present. No lacerations.     Tongue: Tongue does not deviate from midline.     Pharynx: No pharyngeal swelling, oropharyngeal exudate, posterior oropharyngeal erythema or uvula swelling.     Tonsils: No tonsillar exudate.   Eyes:     Extraocular Movements: Extraocular movements intact.      Conjunctiva/sclera: Conjunctivae normal.     Pupils: Pupils are equal, round, and reactive to light.  Cardiovascular:     Rate and Rhythm: Normal rate and regular rhythm.     Heart sounds: No murmur heard.   Pulmonary:     Effort: Pulmonary effort is normal. No respiratory distress.     Breath sounds: Normal breath sounds.  Abdominal:     Palpations: Abdomen is soft.     Tenderness: There is no abdominal tenderness.  Musculoskeletal:        General: Tenderness present.     Cervical back: Neck supple.  Skin:    General: Skin is warm and dry.  Neurological:     Mental Status: He is alert. Mental status is at baseline.  Psychiatric:        Mood and Affect: Mood normal.     ED Results / Procedures / Treatments   Labs (all labs ordered are listed, but only abnormal results are displayed) Labs Reviewed - No data to display  EKG None  Radiology No results found.  Procedures Procedures   Medications Ordered in ED Medications - No data to display  ED Course  I have reviewed the triage vital signs and the nursing notes.  Pertinent labs & imaging results that were available during my care of the patient were reviewed by me and considered in my medical decision making (see chart for details).    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  He reports that he was eating a [employer] sandwich when his left upper tooth cracked near the base.  He reports that he has had some dental troubles in the past and was actually scheduled to see dentistry in several days on Monday to have that tooth removed.  He reports she is going to have a partial denture created as replacement.  He reports the pain radiated towards his cheek and was severe.  He denies any significant bleeding.  He reports no other preceding symptoms and denies any other fevers, chills, cough, chest pain, or other  complaints.  On exam, patient does have a dental fracture to his left upper tooth.  He has significant caries and is difficult to see if there is any exposed dentin or pulp.  No other teeth appear injured or loose.  Lungs are clear and chest is nontender.  Abdomen is nontender.  No other injury seen.  Minimal sinus tenderness but normal extraocular movements.  Had a shared decision made conversation with patient.  As he is going to have that tooth removed and is difficult to see if there is any pulp or dentin exposed, we agreed to hold on trying to cover it with Dermabond or other sealant.  I cannot find any dental cement to use at this time at this facility.  Patient will give prescription for pain medicine and prophylactic  antibiotics.  He reports he is anaphylactic to penicillins will avoid this and use clindamycin instead.  He will follow-up with his dentist in several days and rest and stay hydrated.  He understands return precautions and was discharged in good conditions.    Final Clinical Impression(s) / ED Diagnoses Final diagnoses:  Dental injury, initial encounter    Rx / DC Orders ED Discharge Orders         Ordered    clindamycin (CLEOCIN) 150 MG capsule  3 times daily        10/25/20 1744    oxyCODONE-acetaminophen (PERCOCET/ROXICET) 5-325 MG tablet  Every 4 hours PRN        10/25/20 1744          Clinical Impression: 1. Dental injury, initial encounter     Disposition: Discharge  Condition: Good  I have discussed the results, Dx and Tx plan with the pt(& family if present). He/she/they expressed understanding and agree(s) with the plan. Discharge instructions discussed at great length. Strict return precautions discussed and pt &/or family have verbalized understanding of the instructions. No further questions at time of discharge.    New Prescriptions   CLINDAMYCIN (CLEOCIN) 150 MG CAPSULE    Take 3 capsules (450 mg total) by mouth 3 (three) times daily for 7  days.   OXYCODONE-ACETAMINOPHEN (PERCOCET/ROXICET) 5-325 MG TABLET    Take 1 tablet by mouth every 4 (four) hours as needed for severe pain.    Follow Up: Your dentist on monday        Nioma Mccubbins, Canary Brim, MD 10/25/20 1752

## 2020-12-26 ENCOUNTER — Other Ambulatory Visit (HOSPITAL_BASED_OUTPATIENT_CLINIC_OR_DEPARTMENT_OTHER): Payer: Self-pay

## 2020-12-26 MED ORDER — CARESTART COVID-19 HOME TEST VI KIT
PACK | 0 refills | Status: AC
  Filled 2020-12-26: qty 4, 8d supply, fill #0

## 2021-02-16 ENCOUNTER — Other Ambulatory Visit: Payer: Self-pay

## 2021-02-16 ENCOUNTER — Encounter (HOSPITAL_BASED_OUTPATIENT_CLINIC_OR_DEPARTMENT_OTHER): Payer: Self-pay

## 2021-02-16 ENCOUNTER — Emergency Department (HOSPITAL_BASED_OUTPATIENT_CLINIC_OR_DEPARTMENT_OTHER)
Admission: EM | Admit: 2021-02-16 | Discharge: 2021-02-16 | Disposition: A | Discharge status: Home / Self Care | Payer: No Typology Code available for payment source | Attending: Emergency Medicine | Admitting: Emergency Medicine

## 2021-02-16 DIAGNOSIS — H6123 Impacted cerumen, bilateral: Secondary | ICD-10-CM | POA: Insufficient documentation

## 2021-02-16 HISTORY — DX: Spinal stenosis, lumbar region without neurogenic claudication: M48.061

## 2021-02-16 NOTE — ED Notes (Addendum)
Ears disimpacted bilaterally, per verbal order from Dr. Pilar Plate.

## 2021-02-16 NOTE — ED Triage Notes (Signed)
Pt reports pain to RIGHT ear associated with difficulty hearing and dizziness onset 2 days ago but worsened today; unrelieved with ear drops.

## 2021-02-16 NOTE — Discharge Instructions (Addendum)
You were evaluated in the Emergency Department and after careful evaluation, we did not find any emergent condition requiring admission or further testing in the hospital.  Your exam/testing today is overall reassuring.  Symptoms due to earwax buildup.  Continue using your eardrops at home as we discussed, follow-up with your primary care doctor.  Please return to the Emergency Department if you experience any worsening of your condition.   Thank you for allowing [place] to be a part of your care.

## 2021-02-16 NOTE — ED Notes (Signed)
Dr Bero at bedside.  

## 2021-02-16 NOTE — ED Provider Notes (Signed)
[facility] Emergency Department Provider Note MRN:  [id number]  Arrival date & time: 02/16/21     Chief Complaint   Otalgia   History of Present Illness   Ricky Buckley is a 40 y.o. year-old male with no pertinent past medical presenting to the ED with chief complaint of otalgia.  Location: Right ear Duration: 2 or 3 days Onset: Gradual Timing: Constant Description: Aches Severity: Mild to moderate Exacerbating/Alleviating Factors: None Associated Symptoms: Feels a room spinning sensation sometimes Pertinent Negatives: Denies chest pain or shortness of breath, no headache or vision change  Additional History: Was told he had a cerumen impaction, was told to take eardrops but they are not working  Review of Systems  A complete 10 system review of systems was obtained and all systems are negative except as noted in the HPI and PMH.   Patient's Health History    Past Medical History:  Diagnosis Date   Spinal stenosis of lumbar region    Ureterolithiasis     Past Surgical History:  Procedure Laterality Date   CYSTOSCOPY/RETROGRADE/URETEROSCOPY/STONE EXTRACTION WITH BASKET Left 07/26/2016   Procedure: CYSTOSCOPY/LEFT RETROGRADE/LEFT URETEROSCOPY/STONE EXTRACTION WITH BASKET, LEFT URETERAL STENT PLACEMENT;  Surgeon: Marcine Matar, MD;  Location: [facility];  Service: Urology;  Laterality: Left;   FACIAL COSMETIC SURGERY     lip and ears sewed back together   FINGER FRACTURE SURGERY Left    PINKY   HOLMIUM LASER APPLICATION Left 07/26/2016   Procedure: HOLMIUM LASER APPLICATION;  Surgeon: Marcine Matar, MD;  Location: [facility];  Service: Urology;  Laterality: Left;   KNEE SURGERY Bilateral     No family history on file.  Social History   Socioeconomic History   Marital status: Single    Spouse name: Not on file   Number of children: Not on file   Years of education: Not on file   Highest education level: Not on file  Occupational  History   Not on file  Tobacco Use   Smoking status: Never   Smokeless tobacco: Never  Vaping Use   Vaping Use: Never used  Substance and Sexual Activity   Alcohol use: Yes    Comment: 4 times a year   Drug use: No   Sexual activity: Not on file  Other Topics Concern   Not on file  Social History Narrative   Not on file   Social Determinants of Health   Financial Resource Strain: Not on file  Food Insecurity: Not on file  Transportation Needs: Not on file  Physical Activity: Not on file  Stress: Not on file  Social Connections: Not on file  Intimate Partner Violence: Not on file     Physical Exam   Vitals:   02/16/21 0111  BP: (!) 137/94  Pulse: 82  Resp: 16  Temp: 99.2 F (37.3 C)  SpO2: 98%    CONSTITUTIONAL: Well-appearing, NAD NEURO:  Alert and oriented x 3, no focal deficits EYES:  eyes equal and reactive ENT/NECK:  no LAD, no JVD, bilateral cerumen impaction CARDIO: Regular rate, well-perfused, normal S1 and S2 PULM:  CTAB no wheezing or rhonchi GI/GU:  normal bowel sounds, non-distended, non-tender MSK/SPINE:  No gross deformities, no edema SKIN:  no rash, atraumatic PSYCH:  Appropriate speech and behavior  *Additional and/or pertinent findings included in MDM below  Diagnostic and Interventional Summary    EKG Interpretation  Date/Time:    Ventricular Rate:    PR Interval:    QRS Duration:  QT Interval:    QTC Calculation:   R Axis:     Text Interpretation:         Labs Reviewed - No data to display  No orders to display    Medications - No data to display   Procedures  /  Critical Care Procedures  ED Course and Medical Decision Making  I have reviewed the triage vital signs, the nursing notes, and pertinent available records from the EMR.  Listed above are laboratory and imaging tests that I personally ordered, reviewed, and interpreted and then considered in my medical decision making (see below for details).  Exam is  consistent with a bilateral cerumen impaction.  Normal vital signs, otherwise no signs of other significant pathology.  Vertigo favored peripheral in the setting of inner ear discomfort or inflammation.  It seems that patient did not give the eardrops much of a chance to work.  He is requesting a manual disimpaction, will have nursing staff work on this and reassess.     Nurse and nurse tech were able to do some disimpaction, removal of quite a bit of cerumen.  Patient still having some persistent symptoms.  On my reevaluation there is still some cerumen in the canal.  Patient advised to continue drops at home.  Elmer Sow. Pilar Plate, MD [facility] [email or web address]  Final Clinical Impressions(s) / ED Diagnoses     ICD-10-CM   1. Bilateral impacted cerumen  [phone]       ED Discharge Orders     None        Discharge Instructions Discussed with and Provided to Patient:     Discharge Instructions      You were evaluated in the Emergency Department and after careful evaluation, we did not find any emergent condition requiring admission or further testing in the hospital.  Your exam/testing today is overall reassuring.  Symptoms due to earwax buildup.  Continue using your eardrops at home as we discussed, follow-up with your primary care doctor.  Please return to the Emergency Department if you experience any worsening of your condition.   Thank you for allowing [place] to be a part of your care.        Sabas Sous, MD 02/16/21 [phone]

## 2021-03-29 ENCOUNTER — Emergency Department (HOSPITAL_BASED_OUTPATIENT_CLINIC_OR_DEPARTMENT_OTHER)
Admission: EM | Admit: 2021-03-29 | Discharge: 2021-03-30 | Disposition: A | Discharge status: Home / Self Care | Payer: Medicaid Other | Attending: Emergency Medicine | Admitting: Emergency Medicine

## 2021-03-29 ENCOUNTER — Other Ambulatory Visit: Payer: Self-pay

## 2021-03-29 ENCOUNTER — Encounter (HOSPITAL_BASED_OUTPATIENT_CLINIC_OR_DEPARTMENT_OTHER): Payer: Self-pay | Admitting: *Deleted

## 2021-03-29 DIAGNOSIS — G43409 Hemiplegic migraine, not intractable, without status migrainosus: Secondary | ICD-10-CM | POA: Insufficient documentation

## 2021-03-29 HISTORY — DX: Headache, unspecified: R51.9

## 2021-03-29 MED ORDER — SODIUM CHLORIDE 0.9 % IV BOLUS
1000.0000 mL | Freq: Once | INTRAVENOUS | Status: AC
  Administered 2021-03-30: 1000 mL via INTRAVENOUS

## 2021-03-29 MED ORDER — PROCHLORPERAZINE EDISYLATE 10 MG/2ML IJ SOLN
10.0000 mg | Freq: Once | INTRAMUSCULAR | Status: AC
  Administered 2021-03-30: 10 mg via INTRAVENOUS
  Filled 2021-03-29: qty 2

## 2021-03-29 MED ORDER — DIPHENHYDRAMINE HCL 50 MG/ML IJ SOLN
25.0000 mg | Freq: Once | INTRAMUSCULAR | Status: AC
  Administered 2021-03-30: 25 mg via INTRAVENOUS
  Filled 2021-03-29: qty 1

## 2021-03-29 MED ORDER — SUMATRIPTAN SUCCINATE 6 MG/0.5ML ~~LOC~~ SOLN
6.0000 mg | Freq: Once | SUBCUTANEOUS | Status: AC
  Administered 2021-03-29: 6 mg via SUBCUTANEOUS
  Filled 2021-03-29: qty 0.5

## 2021-03-29 MED ORDER — KETOROLAC TROMETHAMINE 15 MG/ML IJ SOLN
15.0000 mg | Freq: Once | INTRAMUSCULAR | Status: AC
  Administered 2021-03-30: 15 mg via INTRAVENOUS
  Filled 2021-03-29: qty 1

## 2021-03-29 NOTE — ED Notes (Signed)
First contact with patient. Patient arrived via triage from home with complaints of global HA worse on the left side x 10 days. Patient denies trauma - no neuro deficits noted. NIHSS 0. Pt is A&OX 4. Respirations even/unlabored. Patient changed into gown and placed on monitor and call light within reach. Lights dimmed for comfort. Patient updated on plan of care. Will continue to monitor patient.

## 2021-03-29 NOTE — ED Provider Notes (Signed)
[facility] Provider Note: Georgena Spurling, MD, FACEP  CSN: [id number] MRN: [id number] ARRIVAL: 03/29/21 at 2211 ROOM: [place]  Headache   HISTORY OF PRESENT ILLNESS  03/29/21 11:00 PM Ricky Buckley is a 40 y.o. male with a history of headaches which she gets about monthly.  He has never had a formal diagnosis of migraines.  They are usually short-lived and are adequately treated with Excedrin.  He is here with a headache that has been present for the past 10 days.  The pain is located primarily on the left side of the head and it has both throbbing components as well as sharper components near his left eye.  He has photophobia of the left eye but not the right eye.  The pain is severe (10 out of 10, and has had him in tears at times.  He has had nausea as well as a few episodes of vomiting.  He is having no numbness or weakness.  He has taken Excedrin, Tylenol, and Aleve without relief.  The pain has been fairly constant since onset.   Past Medical History:  Diagnosis Date   Headache    Spinal stenosis of lumbar region    Ureterolithiasis     Past Surgical History:  Procedure Laterality Date   CYSTOSCOPY/RETROGRADE/URETEROSCOPY/STONE EXTRACTION WITH BASKET Left 07/26/2016   Procedure: CYSTOSCOPY/LEFT RETROGRADE/LEFT URETEROSCOPY/STONE EXTRACTION WITH BASKET, LEFT URETERAL STENT PLACEMENT;  Surgeon: Franchot Gallo, MD;  Location: [facility];  Service: Urology;  Laterality: Left;   FACIAL COSMETIC SURGERY     lip and ears sewed back together   FINGER FRACTURE SURGERY Left    PINKY   HOLMIUM LASER APPLICATION Left 67/20/9470   Procedure: HOLMIUM LASER APPLICATION;  Surgeon: Franchot Gallo, MD;  Location: [facility];  Service: Urology;  Laterality: Left;   KNEE SURGERY Bilateral     No family history on file.  Social History   Tobacco Use   Smoking status: Never   Smokeless tobacco: Never  Vaping Use   Vaping Use: Never used  Substance Use  Topics   Alcohol use: Not Currently    Comment: 4 times a year   Drug use: No    Prior to Admission medications   Medication Sig Start Date End Date Taking? Authorizing Provider  COVID-19 At Home Antigen Test [facility] COVID-19 HOME TEST) KIT Use as directed within package instructions 12/26/20   Margie Ege, [facility]  oxyCODONE-acetaminophen (PERCOCET/ROXICET) 5-325 MG tablet Take 1 tablet by mouth every 4 (four) hours as needed for severe pain. 10/25/20   Tegeler, Gwenyth Allegra, MD    Allergies Penicillins   REVIEW OF SYSTEMS  Negative except as noted here or in the History of Present Illness.   PHYSICAL EXAMINATION  Initial Vital Signs Blood pressure (!) 154/99, pulse 86, temperature 98.6 F (37 C), temperature source Oral, resp. rate 18, height _0  (1.905 m), weight (!) 156.5 kg, SpO2 96 %.  Examination General: Well-developed, well-nourished male in no acute distress; appearance consistent with age of record HENT: normocephalic; atraumatic; hyperesthesia of the left side of scalp Eyes: pupils equal, round and reactive to light; extraocular muscles intact; left photophobia with slight ptosis Neck: supple Heart: regular rate and rhythm Lungs: clear to auscultation bilaterally Abdomen: soft; nondistended; nontender; bowel sounds present Extremities: No deformity; full range of motion; pulses normal Neurologic: Awake, alert and oriented; motor function intact in all extremities and symmetric; no facial droop Skin: Warm and dry Psychiatric: Flat affect  RESULTS  Summary of this visit's results, reviewed and interpreted by myself:   EKG Interpretation  Date/Time:    Ventricular Rate:    PR Interval:    QRS Duration:   QT Interval:    QTC Calculation:   R Axis:     Text Interpretation:         Laboratory Studies: No results found for this or any previous visit (from the past 24 hour(s)). Imaging Studies: No results found.  ED COURSE and MDM  Nursing notes,  initial and subsequent vitals signs, including pulse oximetry, reviewed and interpreted by myself.  Vitals:   03/29/21 2214 03/29/21 2219 03/29/21 2300 03/30/21 0000  BP:  (!) 154/99 (!) 145/108 (!) 167/110  Pulse:  86 77 66  Resp:  _0 Temp:  98.6 F (37 C)    TempSrc:  Oral    SpO2:  96% 97% 100%  Weight: (!) 156.5 kg     Height: _1  (1.905 m)      Medications  dexamethasone (DECADRON) injection 10 mg (has no administration in time range)  SUMAtriptan (IMITREX) injection 6 mg (6 mg Subcutaneous Given 03/29/21 2324)  sodium chloride 0.9 % bolus 1,000 mL (1,000 mLs Intravenous New Bag/Given 03/30/21 0017)  diphenhydrAMINE (BENADRYL) injection 25 mg (25 mg Intravenous Given 03/30/21 0016)  prochlorperazine (COMPAZINE) injection 10 mg (10 mg Intravenous Given 03/30/21 0016)  ketorolac (TORADOL) 15 MG/ML injection 15 mg (15 mg Intravenous Given 03/30/21 0010)   11:15 PM Patient's presentation may be an atypical presentation of cluster headaches given its location around, and involving, the left eye.  It is severe as is typical for cluster headaches.  It is not consistent with his usual headache pattern.  We will try 100% oxygen as well as sumatriptan subcu and reassess.  11:51 PM No relief with oxygen and sumatriptan.  We will try migraine cocktail.  12:43 AM Patient's pain down to a 6 out of 10 from a 10 out of 10.  Patient states he needs to go due to babysitter issues at home.  We will give him a dose of dexamethasone IV prior to discharge.  I suspect this is represents a sporadic migraine as he got more relief with the migraine cocktail then with treatment for cluster headache.   PROCEDURES  Procedures   ED DIAGNOSES     ICD-10-CM   1. Sporadic migraine  [email or web address], MD 03/30/21 [phone]

## 2021-03-29 NOTE — Progress Notes (Signed)
Placed patient on 100% non-rebreather per MDI order.  Patient tolerating well.

## 2021-03-29 NOTE — ED Triage Notes (Signed)
Headache x 10 days. States Excedrin migraine normally relieves the pain but not this time.

## 2021-03-30 MED ORDER — DEXAMETHASONE SODIUM PHOSPHATE 10 MG/ML IJ SOLN
10.0000 mg | Freq: Once | INTRAMUSCULAR | Status: AC
  Administered 2021-03-30: 10 mg via INTRAVENOUS
  Filled 2021-03-30: qty 1

## 2021-03-30 NOTE — ED Notes (Signed)
No acute distress noted upon this RN's departure of patient. Verified discharge paperwork with name and DOB. Vital signs stable. Patient taken to checkout window. Discharge paperwork discussed with patient. Patient states he needs to get home to his kids - Discussed elevated BP with patient and importance of follow up care for elevated pressure. Patient does not want to wait. No further questions voiced upon discharge.

## 2021-06-03 ENCOUNTER — Other Ambulatory Visit: Payer: Self-pay

## 2021-06-03 ENCOUNTER — Emergency Department (HOSPITAL_BASED_OUTPATIENT_CLINIC_OR_DEPARTMENT_OTHER): Payer: Self-pay

## 2021-06-03 ENCOUNTER — Encounter (HOSPITAL_BASED_OUTPATIENT_CLINIC_OR_DEPARTMENT_OTHER): Payer: Self-pay | Admitting: Emergency Medicine

## 2021-06-03 ENCOUNTER — Emergency Department (HOSPITAL_BASED_OUTPATIENT_CLINIC_OR_DEPARTMENT_OTHER)
Admission: EM | Admit: 2021-06-03 | Discharge: 2021-06-03 | Disposition: A | Discharge status: Home / Self Care | Payer: Self-pay | Attending: Emergency Medicine | Admitting: Emergency Medicine

## 2021-06-03 DIAGNOSIS — J020 Streptococcal pharyngitis: Secondary | ICD-10-CM

## 2021-06-03 DIAGNOSIS — Z20822 Contact with and (suspected) exposure to covid-19: Secondary | ICD-10-CM | POA: Insufficient documentation

## 2021-06-03 DIAGNOSIS — J069 Acute upper respiratory infection, unspecified: Secondary | ICD-10-CM

## 2021-06-03 LAB — RESP PANEL BY RT-PCR (FLU A&B, COVID) ARPGX2
Influenza A by PCR: NEGATIVE
Influenza B by PCR: NEGATIVE
SARS Coronavirus 2 by RT PCR: NEGATIVE

## 2021-06-03 LAB — GROUP A STREP BY PCR: Group A Strep by PCR: DETECTED — AB

## 2021-06-03 MED ORDER — AZITHROMYCIN 500 MG PO TABS: 500.0000 mg | ORAL_TABLET | Freq: Every day | ORAL | 0 refills | Status: AC

## 2021-06-03 MED ORDER — ALBUTEROL SULFATE HFA 108 (90 BASE) MCG/ACT IN AERS
4.0000 | INHALATION_SPRAY | Freq: Once | RESPIRATORY_TRACT | Status: AC
  Administered 2021-06-03: 03:00:00 4 via RESPIRATORY_TRACT
  Filled 2021-06-03: qty 6.7

## 2021-06-03 MED ORDER — BENZONATATE 100 MG PO CAPS: 100.0000 mg | ORAL_CAPSULE | Freq: Three times a day (TID) | ORAL | 0 refills | Status: DC

## 2021-06-03 MED ORDER — PREDNISONE 50 MG PO TABS
60.0000 mg | ORAL_TABLET | Freq: Once | ORAL | Status: AC
  Administered 2021-06-03: 02:00:00 60 mg via ORAL
  Filled 2021-06-03: qty 1

## 2021-06-03 MED ORDER — PREDNISONE 20 MG PO TABS: 60.0000 mg | ORAL_TABLET | Freq: Every day | ORAL | 0 refills | Status: AC

## 2021-06-03 MED ORDER — AZITHROMYCIN 250 MG PO TABS
500.0000 mg | ORAL_TABLET | Freq: Once | ORAL | Status: AC
  Administered 2021-06-03: 03:00:00 500 mg via ORAL
  Filled 2021-06-03: qty 2

## 2021-06-03 MED ORDER — BENZONATATE 100 MG PO CAPS
200.0000 mg | ORAL_CAPSULE | Freq: Once | ORAL | Status: AC
  Administered 2021-06-03: 02:00:00 200 mg via ORAL
  Filled 2021-06-03: qty 2

## 2021-06-03 NOTE — Discharge Instructions (Addendum)
Follow-up COVID and flu testing online.  If your strep test is positive I will send your antibiotic to your pharmacy.  I have also sent steroids to your pharmacy to pick up in the morning.  Take your next dose tomorrow night of your prednisone.  I have also sent cough medicine to the pharmacy as well.

## 2021-06-03 NOTE — ED Provider Notes (Addendum)
[place] EMERGENCY DEPARTMENT Provider Note   CSN: [id number] Arrival date & time: 06/03/21  0210     History Chief Complaint  Patient presents with   Cough    Ricky Buckley is a 40 y.o. male.  The history is provided by the patient.  URI Presenting symptoms: cough and sore throat   Presenting symptoms: no congestion, no ear pain and no fever   Severity:  Moderate Onset quality:  Gradual Duration:  4 days Timing:  Intermittent Progression:  Waxing and waning Relieved by:  Nothing Worsened by:  Nothing Associated symptoms: no arthralgias, no headaches, no myalgias, no neck pain, no sinus pain, no sneezing, no swollen glands and no wheezing   Risk factors: no sick contacts       Past Medical History:  Diagnosis Date   Headache    Spinal stenosis of lumbar region    Ureterolithiasis     Patient Active Problem List   Diagnosis Date Noted   Closed displaced fracture of distal phalanx of right thumb 02/10/2017   Anterior shoulder dislocation, left, initial encounter 07/16/2016    Past Surgical History:  Procedure Laterality Date   CYSTOSCOPY/RETROGRADE/URETEROSCOPY/STONE EXTRACTION WITH BASKET Left 07/26/2016   Procedure: CYSTOSCOPY/LEFT RETROGRADE/LEFT URETEROSCOPY/STONE EXTRACTION WITH BASKET, LEFT URETERAL STENT PLACEMENT;  Surgeon: Franchot Gallo, MD;  Location: [facility];  Service: Urology;  Laterality: Left;   FACIAL COSMETIC SURGERY     lip and ears sewed back together   FINGER FRACTURE SURGERY Left    PINKY   HOLMIUM LASER APPLICATION Left 55/73/2202   Procedure: HOLMIUM LASER APPLICATION;  Surgeon: Franchot Gallo, MD;  Location: [facility];  Service: Urology;  Laterality: Left;   KNEE SURGERY Bilateral        History reviewed. No pertinent family history.  Social History   Tobacco Use   Smoking status: Never   Smokeless tobacco: Never  Vaping Use   Vaping Use: Never used  Substance Use Topics   Alcohol use: Not Currently     Comment: 4 times a year   Drug use: No    Home Medications Prior to Admission medications   Medication Sig Start Date End Date Taking? Authorizing Provider  azithromycin (ZITHROMAX) 500 MG tablet Take 1 tablet (500 mg total) by mouth daily for 4 days. Take first 2 tablets together, then 1 every day until finished. 06/03/21 06/07/21 Yes Ilena Dieckman, DO  benzonatate (TESSALON) 100 MG capsule Take 1 capsule (100 mg total) by mouth every 8 (eight) hours. 06/03/21  Yes Jovoni Borkenhagen, DO  predniSONE (DELTASONE) 20 MG tablet Take 3 tablets (60 mg total) by mouth daily for 4 days. 06/03/21 06/07/21 Yes Antha Niday, DO  COVID-19 At Home Antigen Test [facility] COVID-19 HOME TEST) KIT Use as directed within package instructions 12/26/20   Margie Ege, [facility]  oxyCODONE-acetaminophen (PERCOCET/ROXICET) 5-325 MG tablet Take 1 tablet by mouth every 4 (four) hours as needed for severe pain. 10/25/20   Tegeler, Gwenyth Allegra, MD    Allergies    Penicillins  Review of Systems   Review of Systems  Constitutional:  Negative for chills and fever.  HENT:  Positive for sore throat. Negative for congestion, dental problem, drooling, ear discharge, ear pain, sinus pain, sneezing and trouble swallowing.   Eyes:  Negative for pain and visual disturbance.  Respiratory:  Positive for cough. Negative for shortness of breath and wheezing.   Cardiovascular:  Negative for chest pain and palpitations.  Gastrointestinal:  Negative for abdominal pain and vomiting.  Genitourinary:  Negative for dysuria and hematuria.  Musculoskeletal:  Negative for arthralgias, back pain, myalgias and neck pain.  Skin:  Negative for color change and rash.  Neurological:  Negative for seizures, syncope and headaches.  All other systems reviewed and are negative.  Physical Exam Updated Vital Signs BP (!) 187/107    Pulse 91    Temp 98.2 F (36.8 C) (Oral)    Resp (!) 22    SpO2 98%   Physical Exam Vitals and nursing note  reviewed.  Constitutional:      General: He is not in acute distress.    Appearance: He is well-developed. He is not ill-appearing.  HENT:     Head: Normocephalic and atraumatic.     Right Ear: Tympanic membrane normal.     Nose: Nose normal.     Mouth/Throat:     Pharynx: No oropharyngeal exudate or posterior oropharyngeal erythema.  Eyes:     Conjunctiva/sclera: Conjunctivae normal.     Pupils: Pupils are equal, round, and reactive to light.  Cardiovascular:     Rate and Rhythm: Normal rate and regular rhythm.     Pulses: Normal pulses.     Heart sounds: Normal heart sounds. No murmur heard. Pulmonary:     Effort: Pulmonary effort is normal. No respiratory distress.     Breath sounds: Wheezing (very mild end expiratory wheeze) present.  Abdominal:     Palpations: Abdomen is soft.     Tenderness: There is no abdominal tenderness.  Musculoskeletal:        General: No swelling.     Cervical back: Normal range of motion and neck supple.  Skin:    General: Skin is warm and dry.     Capillary Refill: Capillary refill takes less than 2 seconds.  Neurological:     General: No focal deficit present.     Mental Status: He is alert.  Psychiatric:        Mood and Affect: Mood normal.    ED Results / Procedures / Treatments   Labs (all labs ordered are listed, but only abnormal results are displayed) Labs Reviewed  GROUP A STREP BY PCR - Abnormal; Notable for the following components:      Result Value   Group A Strep by PCR DETECTED (*)    All other components within normal limits  RESP PANEL BY RT-PCR (FLU A&B, COVID) [id number]    EKG None  Radiology DG Chest Portable 1 View  Result Date: 06/03/2021 CLINICAL DATA:  Cough EXAM: PORTABLE CHEST 1 VIEW COMPARISON:  09/30/2019 FINDINGS: Cardiac and mediastinal contours are within normal limits. No focal pulmonary opacity. No pleural effusion or pneumothorax. No acute osseous abnormality. IMPRESSION: No acute cardiopulmonary  process. Electronically Signed   By: Merilyn Baba M.D.   On: 06/03/2021 02:43    Procedures Procedures   Medications Ordered in ED Medications  azithromycin (ZITHROMAX) tablet 500 mg (has no administration in time range)  albuterol (VENTOLIN HFA) 108 (90 Base) MCG/ACT inhaler 4 puff (4 puffs Inhalation Given 06/03/21 0235)  predniSONE (DELTASONE) tablet 60 mg (60 mg Oral Given 06/03/21 0224)  benzonatate (TESSALON) capsule 200 mg (200 mg Oral Given 06/03/21 5102)    ED Course  I have reviewed the triage vital signs and the nursing notes.  Pertinent labs & imaging results that were available during my care of the patient were reviewed by me and considered in my medical decision making (see chart for details).    MDM Rules/Calculators/A&P  Wesam Gearhart is here with cough and congestion and sore throat.  Unremarkable vitals.  No fever.  Bad cough while in the room but sounds dry.  Has been having URI symptoms for the last 4 days.  No fever.  Negative home COVID test.  No known sick contacts.  No signs of strep infection on exam.  We will however swab for strep and COVID and flu with PCR test.  Will obtain chest x-ray.  Overall sounds viral/bronchitis.  Denies smoking history.  Will give albuterol, prednisone and possibly antibiotics depending on chest x-ray.  Room air oxygenation is normal as well as ambulatory oxygenation.  Chest x-ray negative for pneumonia.  Overall suspect viral process.  Likely bronchitis.  Discharged in good condition.  Strep test positive. Abx given.  This chart was dictated using voice recognition software.  Despite best efforts to proofread,  errors can occur which can change the documentation meaning.      Final Clinical Impression(s) / ED Diagnoses Final diagnoses:  Viral URI with cough  Strep pharyngitis    Rx / DC Orders ED Discharge Orders          Ordered    predniSONE (DELTASONE) 20 MG tablet  Daily        06/03/21  0245    benzonatate (TESSALON) 100 MG capsule  Every 8 hours        06/03/21 0245    azithromycin (ZITHROMAX) 500 MG tablet  Daily        06/03/21 0256             Lennice Sites, DO 06/03/21 0246    Lennice Sites, DO 06/03/21 0257    Lennice Sites, DO 06/03/21 9381

## 2021-06-03 NOTE — ED Triage Notes (Signed)
Cough, sore throat, wheezing x 3 - 4 days. Denies n/v, fevers

## 2021-06-03 NOTE — ED Notes (Signed)
Portable xray at bedside.

## 2022-05-21 ENCOUNTER — Emergency Department (HOSPITAL_BASED_OUTPATIENT_CLINIC_OR_DEPARTMENT_OTHER): Payer: Commercial Managed Care - HMO

## 2022-05-21 ENCOUNTER — Encounter (HOSPITAL_BASED_OUTPATIENT_CLINIC_OR_DEPARTMENT_OTHER): Payer: Self-pay

## 2022-05-21 ENCOUNTER — Other Ambulatory Visit: Payer: Self-pay

## 2022-05-21 DIAGNOSIS — W11XXXA Fall on and from ladder, initial encounter: Secondary | ICD-10-CM | POA: Diagnosis not present

## 2022-05-21 DIAGNOSIS — S6992XA Unspecified injury of left wrist, hand and finger(s), initial encounter: Secondary | ICD-10-CM | POA: Diagnosis present

## 2022-05-21 DIAGNOSIS — S63502A Unspecified sprain of left wrist, initial encounter: Secondary | ICD-10-CM | POA: Insufficient documentation

## 2022-05-21 MED ORDER — OXYCODONE-ACETAMINOPHEN 5-325 MG PO TABS
1.0000 | ORAL_TABLET | ORAL | Status: DC | PRN
  Filled 2022-05-21: qty 1

## 2022-05-21 MED ORDER — OXYCODONE-ACETAMINOPHEN 5-325 MG PO TABS
1.0000 | ORAL_TABLET | ORAL | Status: AC
  Administered 2022-05-21: 1 via ORAL

## 2022-05-21 NOTE — ED Triage Notes (Addendum)
Patient arrives POV from home c/o left wrist pain. Pt was on a ladder placing lights on Christmas tree when he fell off the ladder; pt states he fell "about 10 feet" as he was near the top of the ceiling. Pt states pain starts in the middle of his left forearm and radiates downward and wraps around his left wrist. No deformities visible. Patient reports "numbness to his left pinky and ring finger." Patient 800 mg ibuprofen at around 8pm at home.

## 2022-05-22 ENCOUNTER — Ambulatory Visit (INDEPENDENT_AMBULATORY_CARE_PROVIDER_SITE_OTHER): Payer: Commercial Managed Care - HMO | Admitting: Family Medicine

## 2022-05-22 ENCOUNTER — Emergency Department (HOSPITAL_BASED_OUTPATIENT_CLINIC_OR_DEPARTMENT_OTHER)
Admission: EM | Admit: 2022-05-22 | Discharge: 2022-05-22 | Disposition: A | Discharge status: Home / Self Care | Payer: Commercial Managed Care - HMO | Attending: Emergency Medicine | Admitting: Emergency Medicine

## 2022-05-22 ENCOUNTER — Encounter: Payer: Self-pay | Admitting: Family Medicine

## 2022-05-22 VITALS — BP 140/80 | Ht 74.0 in | Wt 350.0 lb

## 2022-05-22 DIAGNOSIS — S63502A Unspecified sprain of left wrist, initial encounter: Secondary | ICD-10-CM | POA: Diagnosis not present

## 2022-05-22 DIAGNOSIS — S63002A Unspecified subluxation of left wrist and hand, initial encounter: Secondary | ICD-10-CM | POA: Diagnosis not present

## 2022-05-22 MED ORDER — IBUPROFEN 400 MG PO TABS
400.0000 mg | ORAL_TABLET | Freq: Once | ORAL | Status: AC
  Administered 2022-05-22: 400 mg via ORAL
  Filled 2022-05-22: qty 1

## 2022-05-22 MED ORDER — ONDANSETRON HCL 4 MG PO TABS: 4.0000 mg | ORAL_TABLET | Freq: Three times a day (TID) | ORAL | 0 refills | Status: DC | PRN

## 2022-05-22 MED ORDER — HYDROCODONE-ACETAMINOPHEN 5-325 MG PO TABS: 1.0000 | ORAL_TABLET | Freq: Three times a day (TID) | ORAL | 0 refills | Status: DC | PRN

## 2022-05-22 NOTE — Progress Notes (Signed)
  Ricky Buckley - 41 y.o. male MRN [id number]  Date of birth: 12/10/1980  SUBJECTIVE:  Including CC & ROS.  No chief complaint on file.   Levan Aloia is a 41 y.o. male that is presenting with acute left wrist pain.  He had a fall off of his ladder onto his left wrist.  He reports his wrist completely bent back.  Having significant pain at the wrist joint and unable to pronate or supinate.  Review of the emergency department note from earlier today shows he was provided a wrist splint. Independent review of the left wrist x-ray from 12/7 shows no acute changes. Independent review of the left forearm x-ray from 12/7 shows no bony changes. Independent review of the left hand x-ray from 12/7 shows no bony changes.  Review of Systems See HPI   HISTORY: Past Medical, Surgical, Social, and Family History Reviewed & Updated per EMR.   Pertinent Historical Findings include:  Past Medical History:  Diagnosis Date   Headache    Spinal stenosis of lumbar region    Ureterolithiasis     Past Surgical History:  Procedure Laterality Date   CYSTOSCOPY/RETROGRADE/URETEROSCOPY/STONE EXTRACTION WITH BASKET Left 07/26/2016   Procedure: CYSTOSCOPY/LEFT RETROGRADE/LEFT URETEROSCOPY/STONE EXTRACTION WITH BASKET, LEFT URETERAL STENT PLACEMENT;  Surgeon: Marcine Matar, MD;  Location: [facility];  Service: Urology;  Laterality: Left;   FACIAL COSMETIC SURGERY     lip and ears sewed back together   FINGER FRACTURE SURGERY Left    PINKY   HOLMIUM LASER APPLICATION Left 07/26/2016   Procedure: HOLMIUM LASER APPLICATION;  Surgeon: Marcine Matar, MD;  Location: [facility];  Service: Urology;  Laterality: Left;   KNEE SURGERY Bilateral      PHYSICAL EXAM:  VS: BP (!) 140/80   Ht 6\' 2"  (1.88 m)   Wt (!) 350 lb (158.8 kg)   BMI 44.94 kg/m  Physical Exam Gen: NAD, alert, cooperative with exam, well-appearing MSK:  Neurovascularly intact    1. Hand/wrist/arm  2. left 3. Long arm splint 4.  Ortho-glass 5. Applied by Dr.     ASSESSMENT & PLAN:   Wrist subluxation, left, initial encounter Acutely occurring.  Having significant pain around the wrist which sounds more consistent with a subluxation as a true dislocation.  Having some altered sensation in the fourth and fifth digit. -Counseled on home exercise therapy and supportive care. -Long-arm splint. -Counseled on sling. -Norco. -if Altered sensation worsens will need to send to hand surgery.

## 2022-05-22 NOTE — Assessment & Plan Note (Signed)
Acutely occurring.  Having significant pain around the wrist which sounds more consistent with a subluxation as a true dislocation.  Having some altered sensation in the fourth and fifth digit. -Counseled on home exercise therapy and supportive care. -Long-arm splint. -Counseled on sling. -Norco. -if Altered sensation worsens will need to send to hand surgery.

## 2022-05-22 NOTE — ED Provider Notes (Signed)
[place] EMERGENCY DEPARTMENT Provider Note   CSN: [id number] Arrival date & time: 05/21/22  2245     History  Chief Complaint  Patient presents with   Wrist Pain    Ricky Buckley is a 41 y.o. male.  The history is provided by the patient.  Patient presents after he fell from a ladder.  He reports he was placing lights on a Christmas tree when he fell around 10 feet from his ladder.  He reports he landed on "everything" but only seemed to injure his left wrist and forearm. He might have hit his head, but no headache, no LOC.  No neck or back pain.  No chest or abdominal pain. He reports pain in the left forearm down to his hand.  He reports some numbness in his middle and ring fingers.     Home Medications Prior to Admission medications   Medication Sig Start Date End Date Taking? Authorizing Provider  COVID-19 At Home Antigen Test [facility] COVID-19 HOME TEST) KIT Use as directed within package instructions 12/26/20   Margie Ege, [facility]      Allergies    Penicillins    Review of Systems   Review of Systems  Musculoskeletal:  Positive for arthralgias. Negative for back pain and neck pain.  Neurological:  Negative for headaches.    Physical Exam Updated Vital Signs BP (!) 140/99 (BP Location: Right Arm)   Pulse 74   Temp 98.2 F (36.8 C) (Oral)   Resp 19   Ht 1.88 m (_0 )   Wt (!) 167.8 kg   SpO2 98%   BMI 47.51 kg/m  Physical Exam CONSTITUTIONAL: Well developed/well nourished HEAD: Normocephalic/atraumatic EYES: EOMI/PERRL ENMT: Mucous membranes moist NECK: supple no meningeal signs SPINE/BACK:entire spine nontender No bruising/crepitance/stepoffs noted to spine CV: S1/S2 noted, no murmurs/rubs/gallops noted LUNGS: Lungs are clear to auscultation bilaterally, no apparent distress Chest-no crepitus ABDOMEN: soft, nontender, obese NEURO: Pt is awake/alert/appropriate, moves all extremitiesx4.  No facial droop.  He reports numbness to his left  middle and ring finger only.  Movement of the left wrist is limited due to pain EXTREMITIES: pulses normal/equal, full ROM There is tenderness noted to the left wrist.  No deformities.  Distal pulses equal and intact.  No bruising or significant swelling.  There is no snuffbox tenderness on the left. He is able to flex and extend the left elbow without difficulty Limitation range of motion of left wrist due to pain. SKIN: warm, color normal   ED Results / Procedures / Treatments   Labs (all labs ordered are listed, but only abnormal results are displayed) Labs Reviewed - No data to display  EKG None  Radiology DG Forearm Left  Result Date: 05/22/2022 CLINICAL DATA:  Fall EXAM: LEFT FOREARM - 2 VIEW COMPARISON:  None Available. FINDINGS: There is no evidence of fracture or other focal bone lesions. Soft tissues are unremarkable. IMPRESSION: Negative. Electronically Signed   By: Rolm Baptise M.D.   On: 05/22/2022 00:16   DG Hand Complete Left  Result Date: 05/22/2022 CLINICAL DATA:  Fall EXAM: LEFT HAND - COMPLETE 3+ VIEW COMPARISON:  None Available. FINDINGS: Screws within the left little finger proximal phalanx. No acute bony abnormality. Specifically, no fracture, subluxation, or dislocation. Soft tissues are intact. IMPRESSION: No acute bony abnormality. Electronically Signed   By: Rolm Baptise M.D.   On: 05/22/2022 00:16   DG Wrist Complete Left  Result Date: 05/22/2022 CLINICAL DATA:  Fall EXAM: LEFT WRIST -  COMPLETE 3+ VIEW COMPARISON:  None Available. FINDINGS: There is no evidence of fracture or dislocation. There is no evidence of arthropathy or other focal bone abnormality. Soft tissues are unremarkable. IMPRESSION: Negative. Electronically Signed   By: Rolm Baptise M.D.   On: 05/22/2022 00:15    Procedures Procedures    Medications Ordered in ED Medications  oxyCODONE-acetaminophen (PERCOCET/ROXICET) 5-325 MG per tablet 1 tablet (1 tablet Oral Given 05/21/22 2327)   ibuprofen (ADVIL) tablet 400 mg (400 mg Oral Given 05/22/22 0345)    ED Course/ Medical Decision Making/ A&P         Glasgow Coma Scale Score: 15      NEXUS Criteria Score: 0            Medical Decision Making Amount and/or Complexity of Data Reviewed Radiology: ordered.  Risk Prescription drug management.   Patient presents after accidental fall from ladder.  Fortunately there are no other signs of acute traumatic injury.  No signs of any head/spinal/torso injury. His only pain is in his left wrist. No snuffbox tenderness to suggest scaphoid injury. I have personally reviewed the x-rays and they are unremarkable/no fracture  Will place in removable splint, refer to sports medicine and hand.         Final Clinical Impression(s) / ED Diagnoses Final diagnoses:  Sprain of left wrist, initial encounter    Rx / DC Orders ED Discharge Orders     None         Ripley Fraise, MD 05/22/22 [phone]

## 2022-05-22 NOTE — Patient Instructions (Signed)
Nice to meet you Please use pain medicine as needed  Please up date me on the numbness   Please send me a message in MyChart with any questions or updates.  Please see me back in 1 week.   --Dr. Jordan Likes

## 2022-05-29 ENCOUNTER — Ambulatory Visit: Payer: Commercial Managed Care - HMO | Admitting: Family Medicine

## 2022-06-02 ENCOUNTER — Telehealth: Payer: Self-pay

## 2022-06-02 NOTE — Telephone Encounter (Signed)
Left msg for patient to call back [phone] to schedule PCP apt. AS, CMA

## 2022-08-04 DIAGNOSIS — S90112A Contusion of left great toe without damage to nail, initial encounter: Secondary | ICD-10-CM | POA: Diagnosis not present

## 2022-09-20 ENCOUNTER — Other Ambulatory Visit: Payer: Self-pay

## 2022-09-20 ENCOUNTER — Encounter (HOSPITAL_BASED_OUTPATIENT_CLINIC_OR_DEPARTMENT_OTHER): Payer: Self-pay | Admitting: Pediatrics

## 2022-09-20 ENCOUNTER — Emergency Department (HOSPITAL_BASED_OUTPATIENT_CLINIC_OR_DEPARTMENT_OTHER)
Admission: EM | Admit: 2022-09-20 | Discharge: 2022-09-20 | Disposition: A | Discharge status: Home / Self Care | Payer: Commercial Managed Care - HMO | Attending: Emergency Medicine | Admitting: Emergency Medicine

## 2022-09-20 ENCOUNTER — Emergency Department (HOSPITAL_BASED_OUTPATIENT_CLINIC_OR_DEPARTMENT_OTHER): Payer: Commercial Managed Care - HMO

## 2022-09-20 DIAGNOSIS — S0990XA Unspecified injury of head, initial encounter: Secondary | ICD-10-CM | POA: Diagnosis not present

## 2022-09-20 DIAGNOSIS — R519 Headache, unspecified: Secondary | ICD-10-CM | POA: Diagnosis not present

## 2022-09-20 DIAGNOSIS — M542 Cervicalgia: Secondary | ICD-10-CM

## 2022-09-20 DIAGNOSIS — W19XXXA Unspecified fall, initial encounter: Secondary | ICD-10-CM | POA: Insufficient documentation

## 2022-09-20 DIAGNOSIS — M25511 Pain in right shoulder: Secondary | ICD-10-CM | POA: Diagnosis not present

## 2022-09-20 DIAGNOSIS — M4802 Spinal stenosis, cervical region: Secondary | ICD-10-CM | POA: Diagnosis not present

## 2022-09-20 MED ORDER — LIDOCAINE 5 % EX PTCH: 1.0000 | MEDICATED_PATCH | CUTANEOUS | 0 refills | Status: AC

## 2022-09-20 MED ORDER — CYCLOBENZAPRINE HCL 5 MG PO TABS: 5.0000 mg | ORAL_TABLET | Freq: Three times a day (TID) | ORAL | 0 refills | Status: DC | PRN

## 2022-09-20 MED ORDER — HYDROCODONE-ACETAMINOPHEN 5-325 MG PO TABS
1.0000 | ORAL_TABLET | Freq: Once | ORAL | Status: AC
  Administered 2022-09-20: 1 via ORAL
  Filled 2022-09-20: qty 1

## 2022-09-20 MED ORDER — KETOROLAC TROMETHAMINE 10 MG PO TABS: 10.0000 mg | ORAL_TABLET | Freq: Four times a day (QID) | ORAL | 0 refills | Status: AC | PRN

## 2022-09-20 NOTE — ED Provider Notes (Signed)
Received handoff from [facility], here for neck pain from fall from ground. Pending CT head/neck. Not on thinners. No weakness of BUE.  Physical Exam  BP (!) 153/102 (BP Location: Right Arm)   Pulse 84   Temp 98.2 F (36.8 C) (Oral)   Resp 16   Ht 6\' 2"  (1.88 m)   Wt (!) 161.9 kg   SpO2 100%   BMI 45.84 kg/m   Physical Exam Vitals and nursing note reviewed.  Constitutional:      General: He is not in acute distress.    Appearance: He is well-developed.  HENT:     Head: Normocephalic and atraumatic.  Eyes:     Conjunctiva/sclera: Conjunctivae normal.  Neck:     Comments: Tenderness to palpation of C6/C7 Cardiovascular:     Rate and Rhythm: Normal rate and regular rhythm.     Heart sounds: No murmur heard. Pulmonary:     Effort: Pulmonary effort is normal. No respiratory distress.     Breath sounds: Normal breath sounds.  Abdominal:     Palpations: Abdomen is soft.     Tenderness: There is no abdominal tenderness.  Musculoskeletal:        General: No swelling.     Cervical back: Neck supple.  Skin:    General: Skin is warm and dry.     Capillary Refill: Capillary refill takes less than 2 seconds.  Neurological:     Mental Status: He is alert.  Psychiatric:        Mood and Affect: Mood normal.     Procedures  Procedures  ED Course / MDM    Medical Decision Making Amount and/or Complexity of Data Reviewed Radiology: ordered.  Risk Prescription drug management.   Signed out pending a CT cervical spine and head.  No acute findings on this.  Discussed chronic findings to patient, no severe stenosis.  Possible cervical strain, versus herniated disc that did not show up on CT neck.  He will need to follow-up with his primary care doctor for further imaging.  We discussed return precautions and I prescribed him Flexeril, Toradol, lidocaine patches to help with his pain control.  He was advised not to use the Flexeril when making important decisions as it may cause  increased sleepiness and fatigue.  And not to use the Toradol, with any other anti-inflammatories.  Return precautions were emphasized.       Pete Pelt, [place] 09/20/22 1928    Loetta Rough, MD 09/21/22 0001

## 2022-09-20 NOTE — ED Triage Notes (Signed)
Stated fall from a standing position while at an embankment of approx 3-56ft; c/o neck pain with tenderness; + head injury with no LOC; denies blood thinners. Mx areas of pain, head, neck, shoulder, back. Pain is exacerbated by movements.

## 2022-09-20 NOTE — Discharge Instructions (Addendum)
Please follow-up with your primary care doctor, and then consult with pain specialist.  You likely have a cervical strain, versus a herniated disc that did not show up on the CT.  You need further evaluation.  I percent prescribed you a prescription anti-inflammatory as well as muscle relaxer.  Do not do not take the prescription anti-inflammatory with any kind of other ibuprofen, meloxicam or naproxen.  Make sure you eating when you are taking this.  Use use heat and ice to help with your pain.  If you have any weakness in your bilateral arms please return to the ER.

## 2022-09-20 NOTE — ED Provider Notes (Signed)
[place] [facility] AT [facility] Provider Note   CSN: [id number] Arrival date & time: 09/20/22  1724     History  Chief Complaint  Patient presents with   Ricky Buckley is a 42 y.o. male.  42 y.o male with no PMH presents to the ED with a chief complaint of neck pain status post fall.  Patient reports he was helping a friend move out of their yard, when suddenly he stepped on water reports his feet went from under him.  He did strike his head on the ground.  Reports that he did not lose consciousness but he did see stars.  He is complaining of severe pain along his neck, state is exacerbated with any kind of movement or rotation.  States that cannot movement neck without feeling the sharp sensation going from his neck down to his right shoulder.  He did take 1 tablet of 800 mg of ibuprofen without any improvement in symptoms.  He reports being in an embankment and falling approximately 3-4 ft. He denies any blood thinners, headache, or other injuries.    The history is provided by the patient.  Fall Pertinent negatives include no chest pain, no abdominal pain and no shortness of breath.       Home Medications Prior to Admission medications   Medication Sig Start Date End Date Taking? Authorizing Provider  cyclobenzaprine (FLEXERIL) 5 MG tablet Take 1 tablet (5 mg total) by mouth 3 (three) times daily as needed for muscle spasms (do not take when operating heavy machinery as this may impair your judgement). 09/20/22  Yes Small, Brooke L, PA  ketorolac (TORADOL) 10 MG tablet Take 1 tablet (10 mg total) by mouth every 6 (six) hours as needed. 09/20/22  Yes Small, Brooke L, PA  lidocaine (LIDODERM) 5 % Place 1 patch onto the skin daily. Remove & Discard patch within 12 hours or as directed by MD 09/20/22  Yes Small, Harley [place], PA  COVID-19 At Home Antigen Test [facility] COVID-19 HOME TEST) KIT Use as directed within package instructions 12/26/20   Sandria Manly, [facility]   HYDROcodone-acetaminophen (NORCO/VICODIN) 5-325 MG tablet Take 1 tablet by mouth every 8 (eight) hours as needed. 05/22/22   Myra Rude, MD  ondansetron (ZOFRAN) 4 MG tablet Take 1 tablet (4 mg total) by mouth every 8 (eight) hours as needed for nausea or vomiting. 05/22/22   Myra Rude, MD      Allergies    Penicillins    Review of Systems   Review of Systems  Constitutional:  Negative for fever.  Respiratory:  Negative for shortness of breath.   Cardiovascular:  Negative for chest pain.  Gastrointestinal:  Negative for abdominal pain, nausea and vomiting.  Musculoskeletal:  Positive for arthralgias and neck pain. Negative for back pain.  All other systems reviewed and are negative.   Physical Exam Updated Vital Signs BP (!) 153/102 (BP Location: Right Arm)   Pulse 84   Temp 98.2 F (36.8 C) (Oral)   Resp 16   Ht 6\' 2"  (1.88 m)   Wt (!) 161.9 kg   SpO2 100%   BMI 45.84 kg/m  Physical Exam Vitals and nursing note reviewed.  Constitutional:      Appearance: Normal appearance.  HENT:     Head: Normocephalic and atraumatic.     Comments: No visible signs of trauma, no abrasion no deformities.     Nose: Nose normal.  Mouth/Throat:     Mouth: Mucous membranes are dry.  Eyes:     Pupils: Pupils are equal, round, and reactive to light.     Comments: Pupils are equal and reactive.   Neck:     Comments: Significant tenderness along C7, exacerbated with palpation and movement.  Cardiovascular:     Rate and Rhythm: Normal rate.  Pulmonary:     Effort: Pulmonary effort is normal.  Abdominal:     General: Abdomen is flat.  Musculoskeletal:     Cervical back: Tenderness and bony tenderness present. No erythema or lacerations. Pain with movement present. Decreased range of motion.     Thoracic back: Normal.     Lumbar back: Normal.       Back:  Skin:    General: Skin is warm and dry.  Neurological:     Mental Status: He is alert and oriented to person,  place, and time.     Motor: No weakness.     Comments: Moves upper extremities without any focal weakness. Ambulate after the injury.      ED Results / Procedures / Treatments   Labs (all labs ordered are listed, but only abnormal results are displayed) Labs Reviewed - No data to display  EKG None  Radiology CT Cervical Spine Wo Contrast  Result Date: 09/20/2022 CLINICAL DATA:  Trauma EXAM: CT CERVICAL SPINE WITHOUT CONTRAST TECHNIQUE: Multidetector CT imaging of the cervical spine was performed without intravenous contrast. Multiplanar CT image reconstructions were also generated. RADIATION DOSE REDUCTION: This exam was performed according to the departmental dose-optimization program which includes automated exposure control, adjustment of the mA and/or kV according to patient size and/or use of iterative reconstruction technique. COMPARISON:  None Available. FINDINGS: Alignment: Normal. Skull base and vertebrae: No acute fracture. No primary bone lesion or focal pathologic process. Soft tissues and spinal canal: No prevertebral fluid or swelling. No visible canal hematoma. Disc levels: There is disc space narrowing and endplate osteophyte formation at C5-C6 and C6-C7. There is bilateral moderate neural foraminal stenosis at C4-C5 secondary to uncovertebral spurring. There is mild central canal stenosis at C5-C6 and C6-C7 secondary to disc osteophyte complexes. Asymmetric osteophytes projecting to the left lateral recess at the C4-C5 and C5-C6 levels. Upper chest: Negative. Other: None. IMPRESSION: 1. No acute fracture or traumatic subluxation of the cervical spine. 2. Degenerative changes at C4-C5, C5-C6 and C6-C7. Electronically Signed   By: Darliss Cheney M.D.   On: 09/20/2022 19:16   CT Head Wo Contrast  Result Date: 09/20/2022 CLINICAL DATA:  Head trauma EXAM: CT HEAD WITHOUT CONTRAST TECHNIQUE: Contiguous axial images were obtained from the base of the skull through the vertex without  intravenous contrast. RADIATION DOSE REDUCTION: This exam was performed according to the departmental dose-optimization program which includes automated exposure control, adjustment of the mA and/or kV according to patient size and/or use of iterative reconstruction technique. COMPARISON:  None Available. FINDINGS: Brain: No evidence of acute infarction, hemorrhage, hydrocephalus, extra-axial collection or mass lesion/mass effect. Vascular: No hyperdense vessel or unexpected calcification. Skull: Normal. Negative for fracture or focal lesion. Sinuses/Orbits: No acute finding. Other: None. IMPRESSION: No acute intracranial abnormality. Electronically Signed   By: Darliss Cheney M.D.   On: 09/20/2022 19:08   DG Shoulder Right  Result Date: 09/20/2022 CLINICAL DATA:  Right shoulder pain. Fall from standing. EXAM: RIGHT SHOULDER - 2+ VIEW COMPARISON:  None Available. FINDINGS: There is no evidence of fracture or dislocation. Acromioclavicular degenerative change with spurring. Soft  tissues are unremarkable. The included right ribs are intact. IMPRESSION: 1. No fracture or dislocation of the right shoulder. 2. Acromioclavicular degenerative change. Electronically Signed   By: Narda RutherfordMelanie  Sanford M.D.   On: 09/20/2022 18:48    Procedures Procedures    Medications Ordered in ED Medications  HYDROcodone-acetaminophen (NORCO/VICODIN) 5-325 MG per tablet 1 tablet (1 tablet Oral Given 09/20/22 1809)    ED Course/ Medical Decision Making/ A&P                             Medical Decision Making Amount and/or Complexity of Data Reviewed Radiology: ordered.  Risk Prescription drug management.   Patient presents to the ED s/po fall while moving his friend from 3-4 feet standing down an embankment. Did strike his head, no LOC and no blood thinners.On exam, he is uncomfortable with palpable pain along C7, difficulty with ranging due to severe pain but no decrease in strength. Sensation is intact to upper and lower  extremities. Given Norco for pain control.   Xray of his right shoulder showed: IMPRESSION:  1. No fracture or dislocation of the right shoulder.  2. Acromioclavicular degenerative change.   CT Head/ CT Cervical Spine is pending. Patient care signed out to incoming team.   Portions of this note were genersated with Dragon dictation software. Dictation errors may occur despite best attempts at proofreading.   Final Clinical Impression(s) / ED Diagnoses Final diagnoses:  Fall, initial encounter  Neck pain    Rx / DC Orders ED Discharge Orders          Ordered    ketorolac (TORADOL) 10 MG tablet  Every 6 hours PRN        09/20/22 1926    cyclobenzaprine (FLEXERIL) 5 MG tablet  3 times daily PRN        09/20/22 1926    lidocaine (LIDODERM) 5 %  Every 24 hours        09/20/22 1926              Claude MangesSoto, Rigo Letts, PA-C 09/21/22 [id number]    Loetta RoughNaasz, Hayley N, MD 09/21/22 1747

## 2022-09-23 ENCOUNTER — Ambulatory Visit (INDEPENDENT_AMBULATORY_CARE_PROVIDER_SITE_OTHER): Payer: Commercial Managed Care - HMO | Admitting: Physical Medicine and Rehabilitation

## 2022-09-23 ENCOUNTER — Encounter: Payer: Self-pay | Admitting: Physical Medicine and Rehabilitation

## 2022-09-23 DIAGNOSIS — M542 Cervicalgia: Secondary | ICD-10-CM | POA: Diagnosis not present

## 2022-09-23 DIAGNOSIS — M25511 Pain in right shoulder: Secondary | ICD-10-CM

## 2022-09-23 DIAGNOSIS — M7918 Myalgia, other site: Secondary | ICD-10-CM

## 2022-09-23 MED ORDER — MELOXICAM 15 MG PO TABS: 15.0000 mg | ORAL_TABLET | Freq: Every day | ORAL | 0 refills | Status: AC

## 2022-09-23 MED ORDER — CYCLOBENZAPRINE HCL 10 MG PO TABS: 10.0000 mg | ORAL_TABLET | Freq: Three times a day (TID) | ORAL | 0 refills | Status: AC | PRN

## 2022-09-23 NOTE — Progress Notes (Unsigned)
Functional Pain Scale - descriptive words and definitions  Unmanageable (7)  Pain interferes with normal ADL's/nothing seems to help/sleep is very difficult/active distractions are very difficult to concentrate on. Severe range order  Average Pain 9  Neck pain in the middle that radiates to the right shoulder. Patient had a fall 4 days. Looking to the sides and up makes pain worse

## 2022-09-23 NOTE — Progress Notes (Unsigned)
Ricky Buckley - 42 y.o. male MRN [id number]  Date of birth: 1981-05-23  Office Visit Note: Visit Date: 09/23/2022 PCP: Pcp, No Referred by: No ref. provider found  Subjective: Chief Complaint  Patient presents with   Neck - Pain   HPI: Ricky Buckley is a 42 y.o. male who comes in today as a self referral for evaluation of acute on chronic bilateral neck pain radiating to right shoulder. Patient reports history of chronic neck pain over the last 8 years. Pain worsened after mechanical fall on 09/20/2022. Patient slipped on wet grass, landing flat on his back. Patient was evaluated in emergency department the day of fall, CT of cervical spine negative for acute fractures/dislocations. There is degenerative changes at C4-C5, C5-C6 and C6-C7 and bilateral moderate foraminal stenosis at C4-C5 secondary to uncovertebral spurring. No high grade spinal canal stenosis noted. CT of head negative for acute intracranial abnormalities. Patient was discharged home with Toradol, Flexeril and Lidocaine patches. His pain worsens with movement and activity, reports severe pain when laying down to sleep at night. He describes pain as sore, aching and tight sensation, currently rates as 9 out of 10.  Some relief of pain with rest and medications. Patient reports he was treated for chronic neck issues while residing in [place] many years ago, was told he need to undergo cervical spine surgery, however he did not proceed with surgical intervention at that time. Patient denies focal weakness, numbness and tingling. No recent trauma or falls.      Review of Systems  Musculoskeletal:  Positive for myalgias and neck pain.  Neurological:  Negative for tingling, sensory change, focal weakness and weakness.  All other systems reviewed and are negative.  Otherwise per HPI.  Assessment & Plan: Visit Diagnoses:    ICD-10-CM   1. Cervicalgia  M54.2 Ambulatory referral to Physical Therapy    2. Acute pain of right  shoulder  M25.511 Ambulatory referral to Physical Therapy    3. Myofascial pain syndrome  M79.18 Ambulatory referral to Physical Therapy    4. Morbid (severe) obesity due to excess calories  E66.01 Ambulatory referral to Physical Therapy       Plan: Findings:  Acute on chronic bilateral neck pain radiating to right shoulder.  Patient continues to have severe pain despite good conservative therapy such as rest and use of medications.  Patient's clinical presentation and exam are consistent with myofascial pain syndrome.  Tenderness noted to bilateral cervical paraspinal regions and right trapezius/rhomboid muscles upon palpation today.  Neck step is to place order for formal physical therapy with our in-house team.  I do feel patient would benefit from manual therapies and possible dry needling treatments. I also discussed medication management with patient in detail today.  I placed prescription for Meloxicam, he is instructed to stop Toradol.  He can continue with Flexeril as needed, I did refill this medication today as well.  Given that fall occurred only several days ago, CT scan of cervical spine is negative for any acute findings and there is no focal weakness noted on exam today I feel that conservative management is the best approach. I would like for patient to follow up in approximately 6 weeks for re-evaluation. If his pain persists we would consider obtaining cervical MRI imaging, depending on MRI imaging we discussed the possibility of cervical epidural steroid injection if appropriate.  Would be helpful if patient could obtain medical records from [place] where he was previously treated. I encouraged him to contact  our office if his pain increases or changes in nature. No red flag symptoms noted upon exam today.     Meds & Orders:  Meds ordered this encounter  Medications   meloxicam (MOBIC) 15 MG tablet    Sig: Take 1 tablet (15 mg total) by mouth daily.    Dispense:  30 tablet     Refill:  0   cyclobenzaprine (FLEXERIL) 10 MG tablet    Sig: Take 1 tablet (10 mg total) by mouth 3 (three) times daily as needed for muscle spasms.    Dispense:  60 tablet    Refill:  0    Orders Placed This Encounter  Procedures   Ambulatory referral to Physical Therapy    Follow-up: Return for 6 week follow up for re-evaluation.   Procedures: No procedures performed      Clinical History: No specialty comments available.   He reports that he has never smoked. He has never used smokeless tobacco. No results for input(s): "HGBA1C", "LABURIC" in the last 8760 hours.  Objective:  VS:  HT:    WT:   BMI:     BP:   HR: bpm  TEMP: ( )  RESP:  Physical Exam Vitals and nursing note reviewed.  HENT:     Head: Normocephalic and atraumatic.     Right Ear: External ear normal.     Left Ear: External ear normal.     Nose: Nose normal.     Mouth/Throat:     Mouth: Mucous membranes are moist.  Eyes:     Extraocular Movements: Extraocular movements intact.  Cardiovascular:     Rate and Rhythm: Normal rate.     Pulses: Normal pulses.  Pulmonary:     Effort: Pulmonary effort is normal.  Abdominal:     General: Abdomen is flat. There is no distension.  Musculoskeletal:        General: Tenderness present.     Cervical back: Tenderness present.     Comments: Discomfort noted with flexion, extension and side-to-side rotation. Patient has good strength in the upper extremities including 5 out of 5 strength in wrist extension, long finger flexion and APB.  There is no atrophy of the hands intrinsically.  Sensation intact bilaterally. Tenderness noted to bilateral cervical paraspinal region and right trapezius/rhomboid muscles.  Negative Hoffman's sign. Negative Spurling's sign.    Skin:    General: Skin is warm and dry.     Capillary Refill: Capillary refill takes less than 2 seconds.  Neurological:     General: No focal deficit present.     Mental Status: He is alert and oriented to  person, place, and time.  Psychiatric:        Mood and Affect: Mood normal.        Behavior: Behavior normal.     Ortho Exam  Imaging: No results found.  Past Medical/Family/Surgical/Social History: Medications & Allergies reviewed per EMR, new medications updated. Patient Active Problem List   Diagnosis Date Noted   Wrist subluxation, left, initial encounter 05/22/2022   Closed displaced fracture of distal phalanx of right thumb 02/10/2017   Anterior shoulder dislocation, left, initial encounter 07/16/2016   Past Medical History:  Diagnosis Date   Headache    Spinal stenosis of lumbar region    Ureterolithiasis    No family history on file. Past Surgical History:  Procedure Laterality Date   CYSTOSCOPY/RETROGRADE/URETEROSCOPY/STONE EXTRACTION WITH BASKET Left 07/26/2016   Procedure: CYSTOSCOPY/LEFT RETROGRADE/LEFT URETEROSCOPY/STONE EXTRACTION WITH BASKET, LEFT URETERAL  STENT PLACEMENT;  Surgeon: Marcine Matar, MD;  Location: [facility];  Service: Urology;  Laterality: Left;   FACIAL COSMETIC SURGERY     lip and ears sewed back together   FINGER FRACTURE SURGERY Left    PINKY   HOLMIUM LASER APPLICATION Left 07/26/2016   Procedure: HOLMIUM LASER APPLICATION;  Surgeon: Marcine Matar, MD;  Location: [facility];  Service: Urology;  Laterality: Left;   KNEE SURGERY Bilateral    Social History   Occupational History   Not on file  Tobacco Use   Smoking status: Never   Smokeless tobacco: Never  Vaping Use   Vaping Use: Never used  Substance and Sexual Activity   Alcohol use: Not Currently    Comment: 4 times a year   Drug use: No   Sexual activity: Not on file

## 2022-09-30 ENCOUNTER — Encounter: Payer: Self-pay | Admitting: *Deleted

## 2022-10-08 ENCOUNTER — Other Ambulatory Visit: Payer: Self-pay | Admitting: Physical Medicine and Rehabilitation

## 2022-10-08 ENCOUNTER — Telehealth: Payer: Self-pay

## 2022-10-08 DIAGNOSIS — M5412 Radiculopathy, cervical region: Secondary | ICD-10-CM

## 2022-10-08 MED ORDER — PREDNISONE 50 MG PO TABS: 50.0000 mg | ORAL_TABLET | Freq: Every day | ORAL | 0 refills | Status: DC

## 2022-10-08 NOTE — Progress Notes (Signed)
Per patient message, he reports increased pain and is concerned he will not be able to complete physical therapy. He can hold on PT for now. We recommend cervical MRI imaging. I also called in regimen of oral Prednisone.

## 2022-10-08 NOTE — Telephone Encounter (Signed)
Patient called triage. He is scheduled to start PT tomorrow, but states that he is unsure if he should. He has increased pain, numbness, and tingling into his right arm. He states that his shoulder pain has gotten so bad he cannot work, and it wakes him at night. He is taking meloxicam and flexeril as prescribed. Please advise. [phone]

## 2022-10-09 ENCOUNTER — Ambulatory Visit: Payer: Commercial Managed Care - HMO | Attending: Physical Medicine and Rehabilitation | Admitting: Physical Therapy

## 2022-10-10 NOTE — Telephone Encounter (Signed)
Spoke with patient and informed him that a MRI was ordered and prednisone was called into the pharmacy

## 2022-10-15 ENCOUNTER — Other Ambulatory Visit: Payer: Self-pay | Admitting: Physical Medicine and Rehabilitation

## 2022-10-15 DIAGNOSIS — M542 Cervicalgia: Secondary | ICD-10-CM

## 2022-10-15 DIAGNOSIS — M5412 Radiculopathy, cervical region: Secondary | ICD-10-CM

## 2022-10-24 ENCOUNTER — Encounter: Payer: Self-pay | Admitting: Physical Medicine and Rehabilitation

## 2022-11-01 ENCOUNTER — Other Ambulatory Visit: Payer: Commercial Managed Care - HMO

## 2022-11-04 ENCOUNTER — Ambulatory Visit: Payer: Commercial Managed Care - HMO | Admitting: Physical Medicine and Rehabilitation

## 2023-04-28 ENCOUNTER — Encounter (HOSPITAL_BASED_OUTPATIENT_CLINIC_OR_DEPARTMENT_OTHER): Payer: Self-pay | Admitting: Emergency Medicine

## 2023-04-28 ENCOUNTER — Other Ambulatory Visit: Payer: Self-pay

## 2023-04-28 ENCOUNTER — Emergency Department (HOSPITAL_BASED_OUTPATIENT_CLINIC_OR_DEPARTMENT_OTHER): Payer: Medicaid Other

## 2023-04-28 ENCOUNTER — Emergency Department (HOSPITAL_BASED_OUTPATIENT_CLINIC_OR_DEPARTMENT_OTHER)
Admission: EM | Admit: 2023-04-28 | Discharge: 2023-04-28 | Discharge status: Against Medical Advice | Payer: Medicaid Other | Attending: Emergency Medicine | Admitting: Emergency Medicine

## 2023-04-28 DIAGNOSIS — Z043 Encounter for examination and observation following other accident: Secondary | ICD-10-CM | POA: Diagnosis not present

## 2023-04-28 DIAGNOSIS — W1830XA Fall on same level, unspecified, initial encounter: Secondary | ICD-10-CM | POA: Insufficient documentation

## 2023-04-28 DIAGNOSIS — Z5321 Procedure and treatment not carried out due to patient leaving prior to being seen by health care provider: Secondary | ICD-10-CM | POA: Diagnosis not present

## 2023-04-28 DIAGNOSIS — M545 Low back pain, unspecified: Secondary | ICD-10-CM | POA: Diagnosis present

## 2023-04-28 NOTE — ED Triage Notes (Signed)
Pt c/o lower back pain; hx of spinal stenosis; fell yesterday off a floor joist onto pile of lumber

## 2023-05-01 ENCOUNTER — Encounter (HOSPITAL_BASED_OUTPATIENT_CLINIC_OR_DEPARTMENT_OTHER): Payer: Self-pay

## 2023-05-01 ENCOUNTER — Other Ambulatory Visit (HOSPITAL_BASED_OUTPATIENT_CLINIC_OR_DEPARTMENT_OTHER): Payer: Self-pay

## 2023-05-01 ENCOUNTER — Emergency Department (HOSPITAL_BASED_OUTPATIENT_CLINIC_OR_DEPARTMENT_OTHER)
Admission: EM | Admit: 2023-05-01 | Discharge: 2023-05-01 | Disposition: A | Discharge status: Home / Self Care | Payer: Medicaid Other | Attending: Emergency Medicine | Admitting: Emergency Medicine

## 2023-05-01 ENCOUNTER — Other Ambulatory Visit: Payer: Self-pay

## 2023-05-01 DIAGNOSIS — Z79899 Other long term (current) drug therapy: Secondary | ICD-10-CM | POA: Insufficient documentation

## 2023-05-01 DIAGNOSIS — M545 Low back pain, unspecified: Secondary | ICD-10-CM | POA: Diagnosis present

## 2023-05-01 DIAGNOSIS — M5441 Lumbago with sciatica, right side: Secondary | ICD-10-CM | POA: Insufficient documentation

## 2023-05-01 DIAGNOSIS — M5442 Lumbago with sciatica, left side: Secondary | ICD-10-CM | POA: Diagnosis not present

## 2023-05-01 MED ORDER — ONDANSETRON 4 MG PO TBDP
4.0000 mg | ORAL_TABLET | Freq: Once | ORAL | Status: AC
  Administered 2023-05-01: 4 mg via ORAL
  Filled 2023-05-01: qty 1

## 2023-05-01 MED ORDER — ONDANSETRON HCL 4 MG PO TABS
4.0000 mg | ORAL_TABLET | Freq: Three times a day (TID) | ORAL | 0 refills | Status: DC | PRN
  Filled 2023-05-01: qty 12, 4d supply, fill #0

## 2023-05-01 MED ORDER — METHOCARBAMOL 500 MG PO TABS
500.0000 mg | ORAL_TABLET | Freq: Two times a day (BID) | ORAL | 0 refills | Status: AC | PRN
  Filled 2023-05-01: qty 14, 7d supply, fill #0

## 2023-05-01 MED ORDER — OXYCODONE HCL 5 MG PO TABS
10.0000 mg | ORAL_TABLET | Freq: Once | ORAL | Status: AC
  Administered 2023-05-01: 10 mg via ORAL
  Filled 2023-05-01: qty 2

## 2023-05-01 MED ORDER — OXYCODONE HCL 5 MG PO TABS
5.0000 mg | ORAL_TABLET | Freq: Four times a day (QID) | ORAL | 0 refills | Status: DC | PRN
  Filled 2023-05-01: qty 12, 3d supply, fill #0

## 2023-05-01 MED ORDER — ONDANSETRON HCL 4 MG PO TABS: 4.0000 mg | ORAL_TABLET | Freq: Once | ORAL | Status: DC

## 2023-05-01 MED ORDER — OXYCODONE HCL 5 MG PO TABS: 5.0000 mg | ORAL_TABLET | Freq: Once | ORAL | Status: DC

## 2023-05-01 MED ORDER — METHYLPREDNISOLONE 4 MG PO TBPK
ORAL_TABLET | ORAL | 0 refills | Status: DC
  Filled 2023-05-01: qty 21, 6d supply, fill #0

## 2023-05-01 MED ORDER — KETOROLAC TROMETHAMINE 60 MG/2ML IM SOLN
60.0000 mg | Freq: Once | INTRAMUSCULAR | Status: AC
  Administered 2023-05-01: 60 mg via INTRAMUSCULAR
  Filled 2023-05-01: qty 2

## 2023-05-01 NOTE — ED Provider Notes (Signed)
[place] [facility] Provider Note   CSN: [id number] Arrival date & time: 05/01/23  0746     History  Chief Complaint  Patient presents with   Fall   Back Pain    Ricky Buckley is a 42 y.o. male presented to the ER with a fall and lower back pain.  Patient reports that he had a mechanical fall at work and landed with his lower back against a surface several days ago.  He did not have significant pain right away but towards evening began having significant pain in his lower back rating down both legs, occasionally to the toes, sometimes the knees.  It has been intolerable.  He did come to the ED 3 or 4 days ago but had to leave due to long waiting times.  He did have an x-ray performed that time which I reviewed which showed no acute fracture.  He states has been taking Tylenol and ibuprofen at home with no relief.  He said the pain is causing it to be difficult for him to sleep at night.  It is worse with any bending or movement.  He does have a history of spinal stenosis.  He does not see a spine clinic or specialty  HPI     Home Medications Prior to Admission medications   Medication Sig Start Date End Date Taking? Authorizing Provider  methocarbamol (ROBAXIN) 500 MG tablet Take 1 tablet (500 mg total) by mouth 2 (two) times daily as needed for up to 14 doses for muscle spasms. 05/01/23  Yes Terald Sleeper, MD  methylPREDNISolone (MEDROL DOSEPAK) 4 MG TBPK tablet Use as directed on package 05/01/23  Yes Emaley Applin, Kermit Balo, MD  ondansetron (ZOFRAN) 4 MG tablet Take 1 tablet (4 mg total) by mouth every 8 (eight) hours as needed for up to 12 doses for nausea or vomiting. 05/01/23  Yes Nimrit Kehres, Kermit Balo, MD  oxyCODONE (ROXICODONE) 5 MG immediate release tablet Take 1 tablet (5 mg total) by mouth every 6 (six) hours as needed for up to 12 doses for severe pain (pain score 7-10). 05/01/23  Yes Terald Sleeper, MD  predniSONE (DELTASONE) 50 MG tablet  Take 1 tablet (50 mg total) by mouth daily with breakfast. Take until completed. 10/08/22   Juanda Chance, NP  COVID-19 At Home Antigen Test [facility] COVID-19 HOME TEST) KIT Use as directed within package instructions 12/26/20   Sandria Manly, [facility]  cyclobenzaprine (FLEXERIL) 10 MG tablet Take 1 tablet (10 mg total) by mouth 3 (three) times daily as needed for muscle spasms. 09/23/22   Juanda Chance, NP  HYDROcodone-acetaminophen (NORCO/VICODIN) 5-325 MG tablet Take 1 tablet by mouth every 8 (eight) hours as needed. 05/22/22   Myra Rude, MD  ketorolac (TORADOL) 10 MG tablet Take 1 tablet (10 mg total) by mouth every 6 (six) hours as needed. 09/20/22   Small, Brooke L, PA  lidocaine (LIDODERM) 5 % Place 1 patch onto the skin daily. Remove & Discard patch within 12 hours or as directed by MD 09/20/22   Small, Brooke L, PA  meloxicam (MOBIC) 15 MG tablet Take 1 tablet (15 mg total) by mouth daily. 09/23/22 09/23/23  Juanda Chance, NP  ondansetron (ZOFRAN) 4 MG tablet Take 1 tablet (4 mg total) by mouth every 8 (eight) hours as needed for nausea or vomiting. 05/22/22   Myra Rude, MD      Allergies    Penicillins  Review of Systems   Review of Systems  Physical Exam Updated Vital Signs BP (!) 142/96 (BP Location: Right Arm)   Pulse 74   Temp 97.9 F (36.6 C) (Oral)   Resp 20   Ht 6\' 3"  (1.905 m)   Wt (!) 163.3 kg   SpO2 99%   BMI 45.00 kg/m  Physical Exam Constitutional:      General: He is not in acute distress.    Appearance: He is obese.  HENT:     Head: Normocephalic and atraumatic.  Eyes:     Conjunctiva/sclera: Conjunctivae normal.     Pupils: Pupils are equal, round, and reactive to light.  Cardiovascular:     Rate and Rhythm: Normal rate and regular rhythm.  Pulmonary:     Effort: Pulmonary effort is normal. No respiratory distress.  Musculoskeletal:     Comments: Significant paraspinal muscle tenderness, pain with any movement of the lower back  Skin:     General: Skin is warm and dry.  Neurological:     General: No focal deficit present.     Mental Status: He is alert and oriented to person, place, and time. Mental status is at baseline.  Psychiatric:        Mood and Affect: Mood normal.        Behavior: Behavior normal.     ED Results / Procedures / Treatments   Labs (all labs ordered are listed, but only abnormal results are displayed) Labs Reviewed - No data to display  EKG None  Radiology No results found.  Procedures Procedures    Medications Ordered in ED Medications  ketorolac (TORADOL) injection 60 mg (has no administration in time range)  oxyCODONE (Oxy IR/ROXICODONE) immediate release tablet 10 mg (has no administration in time range)    ED Course/ Medical Decision Making/ A&P                                 Medical Decision Making Risk Prescription drug management.   Patient is here with a mechanical fall and injury to his lower back.  He reports some symptoms of bilateral radiculopathy.  No red flags for cauda equina syndrome at this time, to warrant emergent MRI.  X-rays performed 4 days ago showed no acute fracture, and given that he did not have immediate pain after his fall, have a lower suspicion for occult fracture to warrant CT imaging.  I suspect this is most likely musculoskeletal and nerve pain.  Given his degree of discomfort we will give him oxycodone, IM Toradol.  Will provide medications for home, as well as referral information for a spine clinic for.  The patient has a safe ride home, he is not driving.  Advised not to drive after receiving narcotics.        Final Clinical Impression(s) / ED Diagnoses Final diagnoses:  Acute bilateral low back pain with bilateral sciatica    Rx / DC Orders ED Discharge Orders          Ordered    oxyCODONE (ROXICODONE) 5 MG immediate release tablet  Every 6 hours PRN        05/01/23 0833    methocarbamol (ROBAXIN) 500 MG tablet  2 times daily PRN         05/01/23 0833    methylPREDNISolone (MEDROL DOSEPAK) 4 MG TBPK tablet        05/01/23 0833    ondansetron (ZOFRAN)  4 MG tablet  Every 8 hours PRN        05/01/23 0833              Terald Sleeper, MD 05/01/23 [phone]

## 2023-05-01 NOTE — ED Triage Notes (Signed)
Pt reports falling on 04/28/2023 onto pile of lumbar. LWBS after triage due to wait time. Now pain is increasing and he can't stand pain now

## 2023-07-15 ENCOUNTER — Other Ambulatory Visit: Payer: Self-pay

## 2023-07-15 ENCOUNTER — Ambulatory Visit (HOSPITAL_COMMUNITY)
Admission: EM | Admit: 2023-07-15 | Discharge: 2023-07-15 | Disposition: A | Discharge status: Home / Self Care | Payer: Medicaid Other | Attending: Family Medicine | Admitting: Family Medicine

## 2023-07-15 ENCOUNTER — Encounter (HOSPITAL_COMMUNITY): Payer: Self-pay

## 2023-07-15 ENCOUNTER — Encounter (HOSPITAL_COMMUNITY): Payer: Self-pay | Admitting: Emergency Medicine

## 2023-07-15 ENCOUNTER — Emergency Department (HOSPITAL_COMMUNITY)
Admission: EM | Admit: 2023-07-15 | Discharge: 2023-07-15 | Discharge status: Against Medical Advice | Payer: Medicaid Other | Attending: Emergency Medicine | Admitting: Emergency Medicine

## 2023-07-15 ENCOUNTER — Emergency Department (HOSPITAL_COMMUNITY): Payer: Medicaid Other

## 2023-07-15 DIAGNOSIS — R030 Elevated blood-pressure reading, without diagnosis of hypertension: Secondary | ICD-10-CM

## 2023-07-15 DIAGNOSIS — R079 Chest pain, unspecified: Secondary | ICD-10-CM | POA: Diagnosis not present

## 2023-07-15 DIAGNOSIS — R0602 Shortness of breath: Secondary | ICD-10-CM | POA: Insufficient documentation

## 2023-07-15 DIAGNOSIS — R072 Precordial pain: Secondary | ICD-10-CM | POA: Diagnosis present

## 2023-07-15 DIAGNOSIS — R110 Nausea: Secondary | ICD-10-CM | POA: Diagnosis not present

## 2023-07-15 DIAGNOSIS — Z5321 Procedure and treatment not carried out due to patient leaving prior to being seen by health care provider: Secondary | ICD-10-CM | POA: Insufficient documentation

## 2023-07-15 DIAGNOSIS — R0789 Other chest pain: Secondary | ICD-10-CM | POA: Diagnosis not present

## 2023-07-15 LAB — CBC
HCT: 47.5 % (ref 39.0–52.0)
Hemoglobin: 15.3 g/dL (ref 13.0–17.0)
MCH: 27.3 pg (ref 26.0–34.0)
MCHC: 32.2 g/dL (ref 30.0–36.0)
MCV: 84.8 fL (ref 80.0–100.0)
Platelets: 392 10*3/uL (ref 150–400)
RBC: 5.6 MIL/uL (ref 4.22–5.81)
RDW: 13.9 % (ref 11.5–15.5)
WBC: 8.3 10*3/uL (ref 4.0–10.5)
nRBC: 0 % (ref 0.0–0.2)

## 2023-07-15 LAB — BASIC METABOLIC PANEL
Anion gap: 11 (ref 5–15)
BUN: 7 mg/dL (ref 6–20)
CO2: 24 mmol/L (ref 22–32)
Calcium: 9.1 mg/dL (ref 8.9–10.3)
Chloride: 101 mmol/L (ref 98–111)
Creatinine, Ser: 1.02 mg/dL (ref 0.61–1.24)
GFR, Estimated: >60 mL/min (ref >60)
Glucose, Bld: 125 mg/dL — ABNORMAL HIGH (ref 70–99)
Potassium: 4.2 mmol/L (ref 3.5–5.1)
Sodium: 136 mmol/L (ref 135–145)

## 2023-07-15 LAB — TROPONIN I (HIGH SENSITIVITY): Troponin I (High Sensitivity): 5 ng/L (ref <18)

## 2023-07-15 MED ORDER — NITROGLYCERIN 0.4 MG SL SUBL: 0.4000 mg | SUBLINGUAL_TABLET | SUBLINGUAL | Status: DC | PRN

## 2023-07-15 MED ORDER — ASPIRIN 81 MG PO CHEW: 324.0000 mg | CHEWABLE_TABLET | Freq: Once | ORAL | Status: DC

## 2023-07-15 NOTE — ED Notes (Signed)
Dr hagler aware of patient in intake

## 2023-07-15 NOTE — ED Triage Notes (Signed)
Urgent care note:   Around 5am woke with chest heaviness and sharp pain.  Reports taking aspirin 500 mg .     Felt somewhat better.  Reports moving lumbar.  Heart started racing, reported same pain and sob.     Patient points to center chest, sternum to epigastric area as painful and tender to touch  No medications given  Medic vitals   163/92 51hr 18rr 98%

## 2023-07-15 NOTE — ED Provider Notes (Signed)
[facility]   [id number] 07/15/23 Arrival Time: 1007  ASSESSMENT & PLAN:  1. Chest pain, unspecified type   2. Elevated blood pressure reading without diagnosis of hypertension    Seen in triage with RN. Patient is 43 yo male who presents here with CP. Reports waking this am with 10/10 chest pain describes as central and sharp without radiation; with associated diaphoresis ("shirt was soaked"); persisted approx 30 min and has eased to about 7/10 here. With associated nausea; no emesis. Associated SOB with pain onset; less now.  Reports h/o "a mild stress-induced heart attack" about 12 years ago. Unclear on this history. Denies any previous cardiac interventions. ECG: Performed today and interpreted by me: sinus bradycardia; no STEMI.  Non-smoker. Quit THC approx 6 months ago. Denies any current recreational drug use. Denies alcohol use.  Denies any recent illnesses/fevers.  BP elevated here. Ambulatory here. Speaks full sentences without difficulty. Lungs CTAB. Bradycardia on exam; regular.   Meds ordered this encounter  Medications     To ED via [facility]; stable upon discharge. Unable to get IV access here before [facility] arraival.  Reviewed expectations re: course of current medical issues. Questions answered. Outlined signs and symptoms indicating need for more acute intervention. Patient verbalized understanding. After Visit Summary given.   OBJECTIVE:  Vitals:   07/15/23 1012 07/15/23 1019  BP: (!) 159/114 (!) 148/100  Pulse: (!) 57 64  Resp: 20   Temp: 98 F (36.7 C)   TempSrc: Oral   SpO2: 100%      Allergies  Allergen Reactions   Penicillins Shortness Of Breath    Past Medical History:  Diagnosis Date   Headache    Spinal stenosis of lumbar region    Ureterolithiasis    Social History   Socioeconomic History   Marital status: Single    Spouse name: Not on file   Number of children: Not on file   Years of education: Not on file    Highest education level: Not on file  Occupational History   Not on file  Tobacco Use   Smoking status: Never   Smokeless tobacco: Never  Vaping Use   Vaping status: Never Used  Substance and Sexual Activity   Alcohol use: Not Currently    Comment: 4 times a year   Drug use: No   Sexual activity: Not on file  Other Topics Concern   Not on file  Social History Narrative   Not on file   Social Drivers of Health   Financial Resource Strain: Not on file  Food Insecurity: Not on file  Transportation Needs: Not on file  Physical Activity: Not on file  Stress: Not on file  Social Connections: Unknown (10/28/2021)   Received from [facility], [facility]   Social Network    Social Network: Not on file  Intimate Partner Violence: Unknown (09/19/2021)   Received from [facility], [facility]   HITS    Physically Hurt: Not on file    Insult or Talk Down To: Not on file    Threaten Physical Harm: Not on file    Scream or Curse: Not on file   History reviewed. No pertinent family history. Past Surgical History:  Procedure Laterality Date   CYSTOSCOPY/RETROGRADE/URETEROSCOPY/STONE EXTRACTION WITH BASKET Left 07/26/2016   Procedure: CYSTOSCOPY/LEFT RETROGRADE/LEFT URETEROSCOPY/STONE EXTRACTION WITH BASKET, LEFT URETERAL STENT PLACEMENT;  Surgeon: Marcine Matar, MD;  Location: [facility];  Service: Urology;  Laterality: Left;   FACIAL COSMETIC SURGERY  lip and ears sewed back together   FINGER FRACTURE SURGERY Left    PINKY   HOLMIUM LASER APPLICATION Left 07/26/2016   Procedure: HOLMIUM LASER APPLICATION;  Surgeon: Marcine Matar, MD;  Location: [facility];  Service: Urology;  Laterality: Left;   KNEE SURGERY Bilateral       Mardella Layman, MD 07/15/23 1128

## 2023-07-15 NOTE — ED Provider Triage Note (Signed)
Emergency Medicine Provider Triage Evaluation Note  Cyncere Sontag , a 43 y.o. male  was evaluated in triage.  Pt complains of chest pain.  Started at 5 AM this morning.  Located in the left chest.  Associate some shortness of breath.  He is not on radiating.  Did seem to be worse when he went to work and started "moving some things around.  It is sharp pain. States he did take some aspirin this morning and did feel better.  Endorses an hour of nausea as well this morning started. Review of Systems  Positive: See above Negative: See above  Physical Exam  BP (!) 170/104 (BP Location: Left Arm)   Pulse 72   Temp 98.1 F (36.7 C) (Oral)   Resp 20   SpO2 100%  Gen:   Awake, no distress   Resp:  Normal effort  MSK:   Moves extremities without difficulty  Other:    Medical Decision Making  Medically screening exam initiated at 11:46 AM.  Appropriate orders placed.  Stanford Strauch was informed that the remainder of the evaluation will be completed by another provider, this initial triage assessment does not replace that evaluation, and the importance of remaining in the ED until their evaluation is complete.  Work up started   Gareth Eagle, PA-C 07/15/23 1148

## 2023-07-15 NOTE — ED Notes (Signed)
Patient is being discharged from the Urgent Care and sent to the Emergency Department via [facility] . Per Dr. Tracie Harrier, patient is in need of higher level of care due to Chest Pain. Patient is aware and verbalizes understanding of plan of care.  Vitals:   07/15/23 1012 07/15/23 1019  BP: (!) 159/114 (!) 148/100  Pulse: (!) 57 64  Resp: 20   Temp: 98 F (36.7 C)   SpO2: 100%

## 2023-07-15 NOTE — ED Triage Notes (Signed)
Around 5am woke with chest heaviness and sharp pain.  Reports taking aspirin 500 mg .    Felt somewhat better.  Reports moving lumbar.  Heart started racing, reported same pain and sob.    Patient points to center chest, sternum to epigastric area as painful and tender to touch

## 2023-07-15 NOTE — ED Notes (Signed)
Patient decided to leave I took his saline lock out

## 2023-07-15 NOTE — ED Notes (Signed)
[employer] R[facility] has been made aware of patient arrival.

## 2023-07-16 ENCOUNTER — Emergency Department (HOSPITAL_COMMUNITY)
Admission: EM | Admit: 2023-07-16 | Discharge: 2023-07-16 | Disposition: A | Discharge status: Home / Self Care | Payer: Medicaid Other | Attending: Emergency Medicine | Admitting: Emergency Medicine

## 2023-07-16 ENCOUNTER — Emergency Department (HOSPITAL_COMMUNITY): Payer: Medicaid Other

## 2023-07-16 DIAGNOSIS — R079 Chest pain, unspecified: Secondary | ICD-10-CM | POA: Insufficient documentation

## 2023-07-16 DIAGNOSIS — R0789 Other chest pain: Secondary | ICD-10-CM | POA: Diagnosis not present

## 2023-07-16 LAB — BASIC METABOLIC PANEL
Anion gap: 6 (ref 5–15)
BUN: 8 mg/dL (ref 6–20)
CO2: 24 mmol/L (ref 22–32)
Calcium: 8.3 mg/dL — ABNORMAL LOW (ref 8.9–10.3)
Chloride: 106 mmol/L (ref 98–111)
Creatinine, Ser: 0.89 mg/dL (ref 0.61–1.24)
GFR, Estimated: >60 mL/min (ref >60)
Glucose, Bld: 129 mg/dL — ABNORMAL HIGH (ref 70–99)
Potassium: 3.8 mmol/L (ref 3.5–5.1)
Sodium: 136 mmol/L (ref 135–145)

## 2023-07-16 LAB — CBC
HCT: 42.9 % (ref 39.0–52.0)
Hemoglobin: 13.4 g/dL (ref 13.0–17.0)
MCH: 27.2 pg (ref 26.0–34.0)
MCHC: 31.2 g/dL (ref 30.0–36.0)
MCV: 87 fL (ref 80.0–100.0)
Platelets: 364 10*3/uL (ref 150–400)
RBC: 4.93 MIL/uL (ref 4.22–5.81)
RDW: 13.9 % (ref 11.5–15.5)
WBC: 9.1 10*3/uL (ref 4.0–10.5)
nRBC: 0 % (ref 0.0–0.2)

## 2023-07-16 LAB — TROPONIN I (HIGH SENSITIVITY)
Troponin I (High Sensitivity): 3 ng/L (ref <18)
Troponin I (High Sensitivity): 2 ng/L (ref <18)

## 2023-07-16 MED ORDER — ALUM & MAG HYDROXIDE-SIMETH 200-200-20 MG/5ML PO SUSP
30.0000 mL | Freq: Once | ORAL | Status: AC
  Administered 2023-07-16: 30 mL via ORAL
  Filled 2023-07-16: qty 30

## 2023-07-16 MED ORDER — ONDANSETRON 4 MG PO TBDP
4.0000 mg | ORAL_TABLET | Freq: Once | ORAL | Status: AC
  Administered 2023-07-16: 4 mg via ORAL
  Filled 2023-07-16: qty 1

## 2023-07-16 NOTE — ED Triage Notes (Signed)
Pt reports chest pain midline to left chest x 5 am this morning woke him out of sleep, pt reports a few hopurs ago has began to radiate down left arm. Pt reports hx of stress MI at age 43. Senn at [facility] this morning and the took him to ER, pt taken by [facility] and then somehow was removed from the board after not being called, pt now here to be seen.

## 2023-07-16 NOTE — ED Provider Notes (Signed)
[place] EMERGENCY DEPARTMENT AT [facility] Provider Note   CSN: [id number] Arrival date & time: 07/16/23  0001     History  Chief Complaint  Patient presents with   Chest Pain    Ricky Buckley is a 43 y.o. male.  7 yoM with a chief complaint of chest pain.  This woke him up from sleep yesterday.  Has been off and on since.  [place] sharp seems to come and go.  He is not sure what makes it better or worse.  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  He went to urgent care and they sent him to the [facility] emergency department for evaluation.  He then left after the MSE process.  Had ongoing symptoms and so came back this morning.  Patient denies history of MI, denies hypertension hyperlipidemia diabetes or smoking.    Patient denies history of PE or DVT denies hemoptysis denies unilateral lower extremity edema denies recent surgery immobilization hospitalization estrogen use or history of cancer.     Chest Pain      Home Medications Prior to Admission medications   Medication Sig Start Date End Date Taking? Authorizing Provider  predniSONE (DELTASONE) 50 MG tablet Take 1 tablet (50 mg total) by mouth daily with breakfast. Take until completed. Patient not taking: Reported on 07/15/2023 10/08/22   Juanda Chance, NP  COVID-19 At [facility] Antigen Test [facility] COVID-19 HOME TEST) KIT Use as directed within package instructions 12/26/20   Sandria Manly, [facility]  cyclobenzaprine (FLEXERIL) 10 MG tablet Take 1 tablet (10 mg total) by mouth 3 (three) times daily as needed for muscle spasms. Patient not taking: Reported on 07/15/2023 09/23/22   Juanda Chance, NP  HYDROcodone-acetaminophen (NORCO/VICODIN) 5-325 MG tablet Take 1 tablet by mouth every 8 (eight) hours as needed. Patient not taking: Reported on 07/15/2023 05/22/22   Myra Rude, MD  ketorolac (TORADOL) 10 MG tablet Take 1 tablet (10 mg total) by mouth every 6 (six) hours as needed. Patient not  taking: Reported on 07/15/2023 09/20/22   Small, Brooke L, PA  lidocaine (LIDODERM) 5 % Place 1 patch onto the skin daily. Remove & Discard patch within 12 hours or as directed by MD 09/20/22   Small, Brooke L, PA  meloxicam (MOBIC) 15 MG tablet Take 1 tablet (15 mg total) by mouth daily. Patient not taking: Reported on 07/15/2023 09/23/22 09/23/23  Juanda Chance, NP  methocarbamol (ROBAXIN) 500 MG tablet Take 1 tablet (500 mg total) by mouth 2 (two) times daily as needed for up to 14 doses for muscle spasms. Patient not taking: Reported on 07/15/2023 05/01/23   Terald Sleeper, MD  methylPREDNISolone (MEDROL DOSEPAK) 4 MG TBPK tablet Use as directed on package for 6 days. Patient not taking: Reported on 07/15/2023 05/01/23   Terald Sleeper, MD  ondansetron (ZOFRAN) 4 MG tablet Take 1 tablet (4 mg total) by mouth every 8 (eight) hours as needed for nausea or vomiting. Patient not taking: Reported on 07/15/2023 05/22/22   Myra Rude, MD  ondansetron (ZOFRAN) 4 MG tablet Take 1 tablet (4 mg total) by mouth every 8 (eight) hours as needed for up to 12 doses for nausea or vomiting. Patient not taking: Reported on 07/15/2023 05/01/23   Terald Sleeper, MD  oxyCODONE (ROXICODONE) 5 MG immediate release tablet Take 1 tablet (5 mg total) by mouth every 6 (six) hours as needed for up to 12 doses for severe pain (  pain score 7-10). Patient not taking: Reported on 07/15/2023 05/01/23   Terald Sleeper, MD      Allergies    Penicillins    Review of Systems   Review of Systems  Cardiovascular:  Positive for chest pain.    Physical Exam Updated Vital Signs BP (!) 148/126 (BP Location: Left Arm)   Pulse (!) 47   Temp 98.2 F (36.8 C) (Oral)   Resp 13   Ht 6\' 2"  (1.88 m)   Wt (!) 170.1 kg   SpO2 100%   BMI 48.15 kg/m  Physical Exam Vitals and nursing note reviewed.  Constitutional:      Appearance: He is well-developed.  HENT:     Head: Normocephalic and atraumatic.  Eyes:     Pupils:  Pupils are equal, round, and reactive to light.  Neck:     Vascular: No JVD.  Cardiovascular:     Rate and Rhythm: Normal rate and regular rhythm.     Heart sounds: No murmur heard.    No friction rub. No gallop.  Pulmonary:     Effort: No respiratory distress.     Breath sounds: No wheezing.  Chest:     Chest wall: Tenderness present.     Comments: Some discomfort with changing positions, some discomfort on palpation. Abdominal:     General: There is no distension.     Tenderness: There is no abdominal tenderness. There is no guarding or rebound.  Musculoskeletal:        General: Normal range of motion.     Cervical back: Normal range of motion and neck supple.  Skin:    Coloration: Skin is not pale.     Findings: No rash.  Neurological:     Mental Status: He is alert and oriented to person, place, and time.  Psychiatric:        Behavior: Behavior normal.     ED Results / Procedures / Treatments   Labs (all labs ordered are listed, but only abnormal results are displayed) Labs Reviewed  BASIC METABOLIC PANEL - Abnormal; Notable for the following components:      Result Value   Glucose, Bld 129 (*)    Calcium 8.3 (*)    All other components within normal limits  CBC  TROPONIN I (HIGH SENSITIVITY)  TROPONIN I (HIGH SENSITIVITY)    EKG EKG Interpretation Date/Time:  Thursday July 16 2023 02:04:06 EST Ventricular Rate:  59 PR Interval:  178 QRS Duration:  100 QT Interval:  419 QTC Calculation: 415 R Axis:   51  Text Interpretation: Sinus rhythm No significant change since last tracing Confirmed by Melene Plan [phone]) on 07/16/2023 7:26:12 AM  Radiology DG Chest Port 1 View Result Date: 07/16/2023 CLINICAL DATA:  Chest pain for several hours, initial encounter EXAM: PORTABLE CHEST 1 VIEW COMPARISON:  Film from the previous day. FINDINGS: The heart size and mediastinal contours are within normal limits. Both lungs are clear. The visualized skeletal structures are  unremarkable. IMPRESSION: No active disease. Electronically Signed   By: Alcide Clever M.D.   On: 07/16/2023 02:51   DG Chest 2 View Result Date: 07/15/2023 CLINICAL DATA:  Chest pain starting this morning none. EXAM: CHEST - 2 VIEW COMPARISON:  AP chest 06/03/2021 FINDINGS: Cardiac silhouette and mediastinal contours are within normal limits. The lungs are clear. No pleural effusion or pneumothorax. Mild-to-moderate multilevel degenerative disc changes of the thoracic spine. IMPRESSION: No active cardiopulmonary disease. Electronically Signed   By: Windy Fast  Viola M.D.   On: 07/15/2023 14:29    Procedures Procedures    Medications Ordered in ED Medications  ondansetron (ZOFRAN-ODT) disintegrating tablet 4 mg (4 mg Oral Given 07/16/23 0748)  alum & mag hydroxide-simeth (MAALOX/MYLANTA) 200-200-20 MG/5ML suspension 30 mL (30 mLs Oral Given 07/16/23 1610)    ED Course/ Medical Decision Making/ A&P                                 Medical Decision Making Amount and/or Complexity of Data Reviewed Labs: ordered.  Risk OTC drugs. Prescription drug management.   43 yo M with a chief complaints of chest pain.  Woke him up from sleep yesterday.  Has been off and on since.  He has some typical and some atypical components in his history.  He did get diaphoretic at 1 point.  Has some radiation to the left arm.  Seems reproducible on exam.  He now has had 3 troponins that are negative.  No significant electrolyte abnormalities.  No leukocytosis.  Chest x-ray independently interpreted by me without focal infiltrate or pneumothorax.  Will treat as musculoskeletal.  PCP follow-up.  7:57 AM:  I have discussed the diagnosis/risks/treatment options with the patient.  Evaluation and diagnostic testing in the emergency department does not suggest an emergent condition requiring admission or immediate intervention beyond what has been performed at this time.  They will follow up with PCP. We also discussed  returning to the ED immediately if new or worsening sx occur. We discussed the sx which are most concerning (e.g., sudden worsening pain, fever, inability to tolerate by mouth) that necessitate immediate return. Medications administered to the patient during their visit and any new prescriptions provided to the patient are listed below.  Medications given during this visit Medications  ondansetron (ZOFRAN-ODT) disintegrating tablet 4 mg (4 mg Oral Given 07/16/23 0748)  alum & mag hydroxide-simeth (MAALOX/MYLANTA) 200-200-20 MG/5ML suspension 30 mL (30 mLs Oral Given 07/16/23 0748)     The patient appears reasonably screen and/or stabilized for discharge and I doubt any other medical condition or other [facility] requiring further screening, evaluation, or treatment in the ED at this time prior to discharge.         Final Clinical Impression(s) / ED Diagnoses Final diagnoses:  Nonspecific chest pain    Rx / DC Orders ED Discharge Orders     None         Melene Plan, DO 07/16/23 9604

## 2023-07-16 NOTE — Discharge Instructions (Signed)
Take 4 over the counter ibuprofen tablets 3 times a day or 2 over-the-counter naproxen tablets twice a day for pain. Also take tylenol 1000mg (2 extra strength) four times a day.    Follow up with your family doc in the office.  Please return for worsening symptoms if you cough up blood or if you pass out.

## 2023-10-11 ENCOUNTER — Emergency Department (HOSPITAL_BASED_OUTPATIENT_CLINIC_OR_DEPARTMENT_OTHER)
Admission: EM | Admit: 2023-10-11 | Discharge: 2023-10-11 | Disposition: A | Discharge status: Home / Self Care | Attending: Emergency Medicine | Admitting: Emergency Medicine

## 2023-10-11 ENCOUNTER — Emergency Department (HOSPITAL_BASED_OUTPATIENT_CLINIC_OR_DEPARTMENT_OTHER)

## 2023-10-11 ENCOUNTER — Other Ambulatory Visit: Payer: Self-pay

## 2023-10-11 ENCOUNTER — Encounter (HOSPITAL_BASED_OUTPATIENT_CLINIC_OR_DEPARTMENT_OTHER): Payer: Self-pay | Admitting: Emergency Medicine

## 2023-10-11 DIAGNOSIS — N23 Unspecified renal colic: Secondary | ICD-10-CM | POA: Diagnosis not present

## 2023-10-11 DIAGNOSIS — N201 Calculus of ureter: Secondary | ICD-10-CM

## 2023-10-11 DIAGNOSIS — E876 Hypokalemia: Secondary | ICD-10-CM | POA: Diagnosis not present

## 2023-10-11 DIAGNOSIS — N132 Hydronephrosis with renal and ureteral calculous obstruction: Secondary | ICD-10-CM | POA: Diagnosis not present

## 2023-10-11 DIAGNOSIS — R109 Unspecified abdominal pain: Secondary | ICD-10-CM | POA: Diagnosis not present

## 2023-10-11 DIAGNOSIS — N281 Cyst of kidney, acquired: Secondary | ICD-10-CM | POA: Diagnosis not present

## 2023-10-11 DIAGNOSIS — N200 Calculus of kidney: Secondary | ICD-10-CM | POA: Diagnosis not present

## 2023-10-11 LAB — COMPREHENSIVE METABOLIC PANEL WITH GFR
ALT: 36 U/L (ref 0–44)
AST: 38 U/L (ref 15–41)
Albumin: 4.3 g/dL (ref 3.5–5.0)
Alkaline Phosphatase: 82 U/L (ref 38–126)
Anion gap: 16 — ABNORMAL HIGH (ref 5–15)
BUN: 11 mg/dL (ref 6–20)
CO2: 22 mmol/L (ref 22–32)
Calcium: 9 mg/dL (ref 8.9–10.3)
Chloride: 101 mmol/L (ref 98–111)
Creatinine, Ser: 1.2 mg/dL (ref 0.61–1.24)
GFR, Estimated: >60 mL/min (ref >60)
Glucose, Bld: 132 mg/dL — ABNORMAL HIGH (ref 70–99)
Potassium: 3.4 mmol/L — ABNORMAL LOW (ref 3.5–5.1)
Sodium: 139 mmol/L (ref 135–145)
Total Bilirubin: 1.9 mg/dL — ABNORMAL HIGH (ref 0.0–1.2)
Total Protein: 8 g/dL (ref 6.5–8.1)

## 2023-10-11 LAB — CBC WITH DIFFERENTIAL/PLATELET
Abs Immature Granulocytes: 0.06 10*3/uL (ref 0.00–0.07)
Basophils Absolute: 0.1 10*3/uL (ref 0.0–0.1)
Basophils Relative: 1 %
Eosinophils Absolute: 0.4 10*3/uL (ref 0.0–0.5)
Eosinophils Relative: 3 %
HCT: 41.5 % (ref 39.0–52.0)
Hemoglobin: 13.7 g/dL (ref 13.0–17.0)
Immature Granulocytes: 1 %
Lymphocytes Relative: 26 %
Lymphs Abs: 3.1 10*3/uL (ref 0.7–4.0)
MCH: 27.8 pg (ref 26.0–34.0)
MCHC: 33 g/dL (ref 30.0–36.0)
MCV: 84.2 fL (ref 80.0–100.0)
Monocytes Absolute: 1.2 10*3/uL — ABNORMAL HIGH (ref 0.1–1.0)
Monocytes Relative: 10 %
Neutro Abs: 6.9 10*3/uL (ref 1.7–7.7)
Neutrophils Relative %: 59 %
Platelets: 376 10*3/uL (ref 150–400)
RBC: 4.93 MIL/uL (ref 4.22–5.81)
RDW: 13.6 % (ref 11.5–15.5)
WBC: 11.7 10*3/uL — ABNORMAL HIGH (ref 4.0–10.5)
nRBC: 0 % (ref 0.0–0.2)

## 2023-10-11 MED ORDER — HYDROMORPHONE HCL 1 MG/ML IJ SOLN
1.0000 mg | Freq: Once | INTRAMUSCULAR | Status: AC
  Administered 2023-10-11: 1 mg via INTRAVENOUS
  Filled 2023-10-11: qty 1

## 2023-10-11 MED ORDER — TAMSULOSIN HCL 0.4 MG PO CAPS: 0.4000 mg | ORAL_CAPSULE | Freq: Every day | ORAL | 0 refills | Status: DC

## 2023-10-11 MED ORDER — OXYCODONE HCL 5 MG PO TABS
5.0000 mg | ORAL_TABLET | Freq: Four times a day (QID) | ORAL | 0 refills | Status: DC | PRN
Start: 2023-10-11 — End: 2024-05-14

## 2023-10-11 MED ORDER — ONDANSETRON HCL 4 MG/2ML IJ SOLN
4.0000 mg | Freq: Once | INTRAMUSCULAR | Status: AC
  Administered 2023-10-11: 4 mg via INTRAVENOUS
  Filled 2023-10-11: qty 2

## 2023-10-11 MED ORDER — ONDANSETRON HCL 4 MG/2ML IJ SOLN
4.0000 mg | Freq: Once | INTRAMUSCULAR | Status: AC | PRN
  Administered 2023-10-11: 4 mg via INTRAVENOUS
  Filled 2023-10-11: qty 2

## 2023-10-11 MED ORDER — ONDANSETRON 4 MG PO TBDP: 4.0000 mg | ORAL_TABLET | Freq: Three times a day (TID) | ORAL | 0 refills | Status: DC | PRN

## 2023-10-11 MED ORDER — PROCHLORPERAZINE EDISYLATE 10 MG/2ML IJ SOLN
10.0000 mg | Freq: Once | INTRAMUSCULAR | Status: AC
  Administered 2023-10-11: 10 mg via INTRAVENOUS
  Filled 2023-10-11: qty 2

## 2023-10-11 MED ORDER — FENTANYL CITRATE PF 50 MCG/ML IJ SOSY
50.0000 ug | PREFILLED_SYRINGE | INTRAMUSCULAR | Status: DC | PRN
  Administered 2023-10-11: 50 ug via INTRAVENOUS
  Filled 2023-10-11: qty 1

## 2023-10-11 MED ORDER — KETOROLAC TROMETHAMINE 30 MG/ML IJ SOLN
30.0000 mg | Freq: Once | INTRAMUSCULAR | Status: AC
  Administered 2023-10-11: 30 mg via INTRAVENOUS
  Filled 2023-10-11: qty 1

## 2023-10-11 MED ORDER — MORPHINE SULFATE (PF) 4 MG/ML IV SOLN
4.0000 mg | INTRAVENOUS | Status: DC | PRN
  Administered 2023-10-11: 4 mg via INTRAVENOUS
  Filled 2023-10-11: qty 1

## 2023-10-11 NOTE — ED Triage Notes (Signed)
 Pt c/o LT lower back and flank pain x 2 hrs

## 2023-10-11 NOTE — Discharge Instructions (Addendum)
 Please make an appointment to follow-up with the urologist as soon as possible.  Drink plenty of fluids.  Take acetaminophen  as needed for pain, add oxycodone  for additional pain relief as needed.  Return to the emergency department if you start running a fever, or if pain is not being adequately controlled.  If you have to return, please consider returning to [facility] emergency department since that is where all of the urologic procedures are done.

## 2023-10-11 NOTE — ED Provider Notes (Signed)
 [place] [facility] Provider Note   CSN: [id number] Arrival date & time: 10/11/23  0103     History  Chief Complaint  Patient presents with   Flank Pain    Ricky Buckley is a 43 y.o. male.  The history is provided by the patient.  Flank Pain  He has history of spinal stenosis and comes in with onset about 2 hours ago of severe left flank pain with slight radiation to the left lower abdomen.  There is associated nausea but no vomiting.  Denies fever or chills.  He does endorse some urinary dribbling.  He has never had pain like this before.   Home Medications Prior to Admission medications   Medication Sig Start Date End Date Taking? Authorizing Provider  ondansetron  (ZOFRAN -ODT) 4 MG disintegrating tablet Take 1 tablet (4 mg total) by mouth every 8 (eight) hours as needed for nausea or vomiting. 10/11/23  Yes Alissa April, MD  tamsulosin (FLOMAX) 0.4 MG CAPS capsule Take 1 capsule (0.4 mg total) by mouth daily. 10/11/23  Yes Alissa April, MD  COVID-19 At Home Antigen Test [facility] COVID-19 HOME TEST) KIT Use as directed within package instructions 12/26/20   Vearl Georgia, [facility]  cyclobenzaprine  (FLEXERIL ) 10 MG tablet Take 1 tablet (10 mg total) by mouth 3 (three) times daily as needed for muscle spasms. Patient not taking: Reported on 07/15/2023 09/23/22   Williams, Megan E, NP  ketorolac  (TORADOL ) 10 MG tablet Take 1 tablet (10 mg total) by mouth every 6 (six) hours as needed. Patient not taking: Reported on 07/15/2023 09/20/22   Small, Brooke L, PA  lidocaine  (LIDODERM ) 5 % Place 1 patch onto the skin daily. Remove & Discard patch within 12 hours or as directed by MD 09/20/22   Small, Brooke L, PA  methocarbamol  (ROBAXIN ) 500 MG tablet Take 1 tablet (500 mg total) by mouth 2 (two) times daily as needed for up to 14 doses for muscle spasms. Patient not taking: Reported on 07/15/2023 05/01/23   Arvilla Birmingham, MD  oxyCODONE  (ROXICODONE ) 5 MG immediate  release tablet Take 1 tablet (5 mg total) by mouth every 6 (six) hours as needed for up to 12 doses for severe pain (pain score 7-10). 10/11/23   Alissa April, MD      Allergies    Penicillins    Review of Systems   Review of Systems  Genitourinary:  Positive for flank pain.  All other systems reviewed and are negative.   Physical Exam Updated Vital Signs BP (!) 146/85   Pulse (!) 51   Temp 98.9 F (37.2 C) (Oral)   Resp (!) 21   SpO2 95%  Physical Exam Vitals and nursing note reviewed.   43 year old male, in obvious pain, but is in no acute distress. Vital signs are significant for elevated blood pressure. Oxygen saturation is 95%, which is normal. Head is normocephalic and atraumatic. PERRLA, EOMI. Back is nontender in the midline.  There is marked left CVA tenderness. Lungs are clear without rales, wheezes, or rhonchi. Chest is nontender. Heart has regular rate and rhythm without murmur. Abdomen is soft, flat, nontender. Skin is warm and dry without rash. Neurologic: Mental status is normal, moves all extremities equally.  ED Results / Procedures / Treatments   Labs (all labs ordered are listed, but only abnormal results are displayed) Labs Reviewed  COMPREHENSIVE METABOLIC PANEL WITH GFR - Abnormal; Notable for the following components:      Result  Value   Potassium 3.4 (*)    Glucose, Bld 132 (*)    Total Bilirubin 1.9 (*)    Anion gap 16 (*)    All other components within normal limits  CBC WITH DIFFERENTIAL/PLATELET - Abnormal; Notable for the following components:   WBC 11.7 (*)    Monocytes Absolute 1.2 (*)    All other components within normal limits  URINALYSIS, ROUTINE W REFLEX MICROSCOPIC    EKG None  Radiology CT Renal Stone Study Result Date: 10/11/2023 CLINICAL DATA:  Left flank pain, left back pain EXAM: CT ABDOMEN AND PELVIS WITHOUT CONTRAST TECHNIQUE: Multidetector CT imaging of the abdomen and pelvis was performed following the standard  protocol without IV contrast. RADIATION DOSE REDUCTION: This exam was performed according to the departmental dose-optimization program which includes automated exposure control, adjustment of the mA and/or kV according to patient size and/or use of iterative reconstruction technique. COMPARISON:  06/04/2017 FINDINGS: Lower chest: No acute abnormality. Hepatobiliary: No focal liver abnormality is seen. No gallstones, gallbladder wall thickening, or biliary dilatation. Pancreas: Unremarkable Spleen: Unremarkable Adrenals/Urinary Tract: The adrenal glands are unremarkable. The kidneys are normal in size and position. There is mild left hydronephrosis secondary to an obstructing 4 mm calculus within the left ureteropelvic junction. Superimposed moderate bilateral nonobstructing nephrolithiasis with numerous nonobstructing calculi measuring up to 6 mm. No hydronephrosis on the right. No additional ureteral calculi. 4.4 cm exophytic right renal cyst was better characterized as a simple cortical cyst on prior CT examination. No perinephric fluid collections. Bladder unremarkable. Stomach/Bowel: Stomach is within normal limits. Appendix appears normal. No evidence of bowel wall thickening, distention, or inflammatory changes. Vascular/Lymphatic: No significant vascular findings are present. No enlarged abdominal or pelvic lymph nodes. Reproductive: Prostate is unremarkable. Other: No abdominal wall hernia or abnormality. No abdominopelvic ascites. Musculoskeletal: No acute or significant osseous findings. IMPRESSION: 1. Mild left hydronephrosis secondary to an obstructing 4 mm calculus within the left ureteropelvic junction. 2. Superimposed moderate bilateral nonobstructing nephrolithiasis. Electronically Signed   By: Worthy Heads M.D.   On: 10/11/2023 03:57    Procedures Procedures    Medications Ordered in ED Medications  fentaNYL  (SUBLIMAZE ) injection 50 mcg (50 mcg Intravenous Given 10/11/23 0121)  morphine   (PF) 4 MG/ML injection 4 mg (4 mg Intravenous Given 10/11/23 0211)  ondansetron  (ZOFRAN ) injection 4 mg (4 mg Intravenous Given 10/11/23 0121)  ondansetron  (ZOFRAN ) injection 4 mg (4 mg Intravenous Given 10/11/23 0209)  HYDROmorphone  (DILAUDID ) injection 1 mg (1 mg Intravenous Given 10/11/23 0252)  HYDROmorphone  (DILAUDID ) injection 1 mg (1 mg Intravenous Given 10/11/23 0407)  prochlorperazine  (COMPAZINE ) injection 10 mg (10 mg Intravenous Given 10/11/23 0408)  HYDROmorphone  (DILAUDID ) injection 1 mg (1 mg Intravenous Given 10/11/23 0510)  ketorolac  (TORADOL ) 30 MG/ML injection 30 mg (30 mg Intravenous Given 10/11/23 0509)    ED Course/ Medical Decision Making/ A&P                                 Medical Decision Making Amount and/or Complexity of Data Reviewed Labs: ordered. Radiology: ordered.  Risk Prescription drug management.   Severe left flank pain concerning for renal colic.  Differential diagnosis also includes, but is not limited to, diverticulitis, abdominal aortic aneurysm, musculoskeletal pain, pyelonephritis.  I have ordered morphine  for pain, ondansetron  for nausea.  I have ordered screening labs of CBC, comprehensive metabolic panel, urinalysis and I have ordered a CT scan of his abdomen and  pelvis using renal stone protocol.  I have reviewed his past records and note is CT renal stone study on 05/16/2017 at which time he had calculi seen in the right kidney but not in the left.  On 07/26/2016 he had a CT renal stone study showing left-sided ureteral calculi.  I have reviewed his laboratory tests, and my interpretation is elevated random glucose level, borderline low potassium level, mild leukocytosis which is nonspecific.  CT scan shows 4 mm obstructing left proximal ureteral calculus with mild hydronephrosis, bilateral nephrolithiasis also noted.  I have independently viewed the images, and agree with the radiologist's interpretation.  Patient's pain has been difficult to control.   He has received multiple doses of morphine , hydromorphone , fentanyl  and ketorolac .  He is continuing to complain of pain, but he states that he does have to go home so that he can meet his daughter who is coming to the house.  He also states that in the past he has always needed some sort of a surgical procedure to pass his kidney stones.  I am discharging him with prescriptions for tamsulosin, ondansetron  oral dissolving tablet, oxycodone .  Return precautions discussed.  I have advised him to return to [facility] if he is having problems as that is where any procedure would have to be done.  Final Clinical Impression(s) / ED Diagnoses Final diagnoses:  Ureterolithiasis  Renal colic  Hypokalemia    Rx / DC Orders ED Discharge Orders          Ordered    tamsulosin (FLOMAX) 0.4 MG CAPS capsule  Daily        10/11/23 0544    ondansetron  (ZOFRAN -ODT) 4 MG disintegrating tablet  Every 8 hours PRN        10/11/23 0544    oxyCODONE  (ROXICODONE ) 5 MG immediate release tablet  Every 6 hours PRN        10/11/23 0544              Alissa April, MD 10/11/23 [phone]

## 2023-10-11 NOTE — ED Notes (Signed)
 Emesis bag provided to patient per request.

## 2024-05-05 ENCOUNTER — Other Ambulatory Visit: Payer: Self-pay

## 2024-05-05 ENCOUNTER — Emergency Department (HOSPITAL_BASED_OUTPATIENT_CLINIC_OR_DEPARTMENT_OTHER)
Admission: EM | Admit: 2024-05-05 | Discharge: 2024-05-05 | Disposition: A | Discharge status: Home / Self Care | Attending: Emergency Medicine | Admitting: Emergency Medicine

## 2024-05-05 ENCOUNTER — Encounter (HOSPITAL_BASED_OUTPATIENT_CLINIC_OR_DEPARTMENT_OTHER): Payer: Self-pay | Admitting: Urology

## 2024-05-05 ENCOUNTER — Emergency Department (HOSPITAL_BASED_OUTPATIENT_CLINIC_OR_DEPARTMENT_OTHER)

## 2024-05-05 DIAGNOSIS — Z0389 Encounter for observation for other suspected diseases and conditions ruled out: Secondary | ICD-10-CM | POA: Diagnosis not present

## 2024-05-05 DIAGNOSIS — R109 Unspecified abdominal pain: Secondary | ICD-10-CM | POA: Diagnosis present

## 2024-05-05 DIAGNOSIS — N200 Calculus of kidney: Secondary | ICD-10-CM | POA: Diagnosis not present

## 2024-05-05 DIAGNOSIS — I700 Atherosclerosis of aorta: Secondary | ICD-10-CM | POA: Diagnosis not present

## 2024-05-05 DIAGNOSIS — R42 Dizziness and giddiness: Secondary | ICD-10-CM | POA: Diagnosis not present

## 2024-05-05 DIAGNOSIS — N281 Cyst of kidney, acquired: Secondary | ICD-10-CM | POA: Diagnosis not present

## 2024-05-05 DIAGNOSIS — K529 Noninfective gastroenteritis and colitis, unspecified: Secondary | ICD-10-CM | POA: Insufficient documentation

## 2024-05-05 LAB — CBC
HCT: 40.9 % (ref 39.0–52.0)
Hemoglobin: 13.7 g/dL (ref 13.0–17.0)
MCH: 28.1 pg (ref 26.0–34.0)
MCHC: 33.5 g/dL (ref 30.0–36.0)
MCV: 83.8 fL (ref 80.0–100.0)
Platelets: 351 K/uL (ref 150–400)
RBC: 4.88 MIL/uL (ref 4.22–5.81)
RDW: 13.6 % (ref 11.5–15.5)
WBC: 11.8 K/uL — ABNORMAL HIGH (ref 4.0–10.5)
nRBC: 0 % (ref 0.0–0.2)

## 2024-05-05 LAB — URINALYSIS, ROUTINE W REFLEX MICROSCOPIC
Bilirubin Urine: NEGATIVE
Glucose, UA: NEGATIVE mg/dL
Hgb urine dipstick: NEGATIVE
Ketones, ur: NEGATIVE mg/dL
Leukocytes,Ua: NEGATIVE
Nitrite: NEGATIVE
Protein, ur: NEGATIVE mg/dL
Specific Gravity, Urine: 1.02 (ref 1.005–1.030)
pH: 6 (ref 5.0–8.0)

## 2024-05-05 LAB — COMPREHENSIVE METABOLIC PANEL WITH GFR
ALT: 19 U/L (ref 0–44)
AST: 26 U/L (ref 15–41)
Albumin: 4.1 g/dL (ref 3.5–5.0)
Alkaline Phosphatase: 80 U/L (ref 38–126)
Anion gap: 12 (ref 5–15)
BUN: 10 mg/dL (ref 6–20)
CO2: 25 mmol/L (ref 22–32)
Calcium: 8.9 mg/dL (ref 8.9–10.3)
Chloride: 102 mmol/L (ref 98–111)
Creatinine, Ser: 0.81 mg/dL (ref 0.61–1.24)
GFR, Estimated: >60 mL/min (ref >60)
Glucose, Bld: 141 mg/dL — ABNORMAL HIGH (ref 70–99)
Potassium: 3.8 mmol/L (ref 3.5–5.1)
Sodium: 138 mmol/L (ref 135–145)
Total Bilirubin: 0.8 mg/dL (ref 0.0–1.2)
Total Protein: 7.3 g/dL (ref 6.5–8.1)

## 2024-05-05 LAB — LIPASE, BLOOD: Lipase: 25 U/L (ref 11–51)

## 2024-05-05 MED ORDER — CIPROFLOXACIN HCL 500 MG PO TABS
500.0000 mg | ORAL_TABLET | Freq: Two times a day (BID) | ORAL | 0 refills | Status: AC
Start: 2024-05-05 — End: ?

## 2024-05-05 MED ORDER — IOHEXOL 350 MG/ML SOLN
125.0000 mL | Freq: Once | INTRAVENOUS | Status: AC | PRN
Start: 2024-05-05 — End: 2024-05-05
  Administered 2024-05-05: 125 mL via INTRAVENOUS

## 2024-05-05 MED ORDER — KETOROLAC TROMETHAMINE 30 MG/ML IJ SOLN: 30.0000 mg | Freq: Once | INTRAMUSCULAR | Status: DC

## 2024-05-05 MED ORDER — DICYCLOMINE HCL 10 MG PO CAPS
20.0000 mg | ORAL_CAPSULE | Freq: Once | ORAL | Status: AC
  Administered 2024-05-05: 20 mg via ORAL
  Filled 2024-05-05: qty 2

## 2024-05-05 MED ORDER — ONDANSETRON HCL 4 MG/2ML IJ SOLN
4.0000 mg | Freq: Once | INTRAMUSCULAR | Status: AC
  Administered 2024-05-05: 4 mg via INTRAVENOUS
  Filled 2024-05-05: qty 2

## 2024-05-05 MED ORDER — ONDANSETRON 4 MG PO TBDP: ORAL_TABLET | ORAL | 0 refills | Status: AC

## 2024-05-05 MED ORDER — CIPROFLOXACIN HCL 500 MG PO TABS
500.0000 mg | ORAL_TABLET | Freq: Once | ORAL | Status: AC
  Administered 2024-05-05: 500 mg via ORAL
  Filled 2024-05-05: qty 1

## 2024-05-05 MED ORDER — METRONIDAZOLE 500 MG PO TABS
500.0000 mg | ORAL_TABLET | Freq: Once | ORAL | Status: AC
  Administered 2024-05-05: 500 mg via ORAL
  Filled 2024-05-05: qty 1

## 2024-05-05 MED ORDER — METRONIDAZOLE 500 MG PO TABS: 500.0000 mg | ORAL_TABLET | Freq: Two times a day (BID) | ORAL | 0 refills | Status: AC

## 2024-05-05 MED ORDER — DICYCLOMINE HCL 20 MG PO TABS: 20.0000 mg | ORAL_TABLET | Freq: Two times a day (BID) | ORAL | 0 refills | Status: AC | PRN

## 2024-05-05 MED ORDER — FENTANYL CITRATE (PF) 50 MCG/ML IJ SOSY
50.0000 ug | PREFILLED_SYRINGE | Freq: Once | INTRAMUSCULAR | Status: AC
  Administered 2024-05-05: 50 ug via INTRAVENOUS
  Filled 2024-05-05: qty 1

## 2024-05-05 MED ORDER — SODIUM CHLORIDE 0.9 % IV BOLUS
1000.0000 mL | Freq: Once | INTRAVENOUS | Status: AC
  Administered 2024-05-05: 1000 mL via INTRAVENOUS

## 2024-05-05 NOTE — ED Provider Notes (Signed)
 [place] [facility] Provider Note   CSN: [id number] Arrival date & time: 05/05/24  8164     Patient presents with: Abdominal Pain   Herman Fiero is a 43 y.o. male here presenting with abdominal pain and blood in the stool and headache and dizziness.  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  He states that he was responsible to clean up that site and that site was full of cat and mouse poop. Patient states that for the last several days, he has been having nausea and vomiting and diarrhea.  He states that initially he had some blood in his stool but that has resolved.  He states that he has been having some lightheadedness and dizziness.  He states that today he got very dizzy and almost passed out.  His significant other also works here and has similar symptoms but not as severe.   The history is provided by the patient.       Prior to Admission medications   Medication Sig Start Date End Date Taking? Authorizing Provider  COVID-19 At Home Antigen Test [facility] COVID-19 HOME TEST) KIT Use as directed within package instructions 12/26/20   Rhett Knee, [facility]  cyclobenzaprine  (FLEXERIL ) 10 MG tablet Take 1 tablet (10 mg total) by mouth 3 (three) times daily as needed for muscle spasms. Patient not taking: Reported on 07/15/2023 09/23/22   Williams, Megan E, NP  ketorolac  (TORADOL ) 10 MG tablet Take 1 tablet (10 mg total) by mouth every 6 (six) hours as needed. Patient not taking: Reported on 07/15/2023 09/20/22   Small, Brooke L, PA  lidocaine  (LIDODERM ) 5 % Place 1 patch onto the skin daily. Remove & Discard patch within 12 hours or as directed by MD 09/20/22   Small, Brooke L, PA  methocarbamol  (ROBAXIN ) 500 MG tablet Take 1 tablet (500 mg total) by mouth 2 (two) times daily as needed for up to 14 doses for muscle spasms. Patient not taking: Reported on 07/15/2023 05/01/23   Cottie Donnice PARAS, MD  ondansetron   (ZOFRAN -ODT) 4 MG disintegrating tablet Take 1 tablet (4 mg total) by mouth every 8 (eight) hours as needed for nausea or vomiting. 10/11/23   Raford Lenis, MD  oxyCODONE  (ROXICODONE ) 5 MG immediate release tablet Take 1 tablet (5 mg total) by mouth every 6 (six) hours as needed for up to 12 doses for severe pain (pain score 7-10). 10/11/23   Raford Lenis, MD  tamsulosin  (FLOMAX ) 0.4 MG CAPS capsule Take 1 capsule (0.4 mg total) by mouth daily. 10/11/23   Raford Lenis, MD    Allergies: Penicillins    Review of Systems  Gastrointestinal:  Positive for abdominal pain.  Neurological:  Positive for dizziness.  All other systems reviewed and are negative.   Updated Vital Signs BP (!) 172/104 (BP Location: Right Arm)   Pulse 82   Temp 98.4 F (36.9 C)   Resp 20   Ht 6' 2 (1.88 m)   Wt (!) 170 kg   SpO2 97%   BMI 48.12 kg/m   Physical Exam Vitals and nursing note reviewed.  Constitutional:      Comments: Slightly dehydrated  HENT:     Head: Normocephalic.     Mouth/Throat:     Pharynx: Oropharynx is clear.  Eyes:     Extraocular Movements: Extraocular movements intact.     Pupils: Pupils are equal, round, and reactive to light.  Cardiovascular:  Rate and Rhythm: Normal rate and regular rhythm.     Heart sounds: Normal heart sounds.  Pulmonary:     Effort: Pulmonary effort is normal.     Breath sounds: Normal breath sounds.  Abdominal:     General: Abdomen is flat.     Comments: Mild diffuse tenderness but no rebound  Skin:    General: Skin is warm.     Capillary Refill: Capillary refill takes less than 2 seconds.  Neurological:     General: No focal deficit present.     Mental Status: He is oriented to person, place, and time.  Psychiatric:        Mood and Affect: Mood normal.        Behavior: Behavior normal.     (all labs ordered are listed, but only abnormal results are displayed) Labs Reviewed  COMPREHENSIVE METABOLIC PANEL WITH GFR - Abnormal; Notable for the  following components:      Result Value   Glucose, Bld 141 (*)    All other components within normal limits  CBC - Abnormal; Notable for the following components:   WBC 11.8 (*)    All other components within normal limits  LIPASE, BLOOD  URINALYSIS, ROUTINE W REFLEX MICROSCOPIC    EKG: None  Radiology: No results found.   Procedures   Medications Ordered in the ED  iohexol  (OMNIPAQUE ) 350 MG/ML injection 125 mL (has no administration in time range)  sodium chloride  0.9 % bolus 1,000 mL (1,000 mLs Intravenous New Bag/Given 05/05/24 2032)  fentaNYL  (SUBLIMAZE ) injection 50 mcg (50 mcg Intravenous Given 05/05/24 2035)  ondansetron  (ZOFRAN ) injection 4 mg (4 mg Intravenous Given 05/05/24 2034)                                    Medical Decision Making Jarett Dralle is a 43 y.o. male here presenting with dizziness and abdominal pain.  Patient was involved in cleaning up with unsanitary environment.  I wonder if he had side effect of the toxic fumes versus gastroenteritis versus obstruction versus dehydration.  Low suspicion for posterior circulation stroke or dissection.  Plan to get CBC and CMP and lipase and CTA head and neck and CT abdomen pelvis.  Will hydrate patient and reassess.  9:52 PM White blood cell count is 11.8.  CT showed distal colon mild colitis.  Patient does have renal cyst and renal calculi but they are are not obstructing.  UA did not show any UTI.  CTA head and neck unremarkable.  Patient's dizziness likely from dehydration and colitis.  Patient has a penicillin allergy so I prescribed Cipro  and Flagyl and Bentyl for cramps and Zofran  for nausea.  I wonder if he has underlying IBD so we will refer patient to GI for colonoscopy  Problems Addressed: Colitis: acute illness or injury Dizziness: acute illness or injury  Amount and/or Complexity of Data Reviewed Labs: ordered. Decision-making details documented in ED Course. Radiology: ordered and independent  interpretation performed. Decision-making details documented in ED Course.  Risk Prescription drug management.     Final diagnoses:  None    ED Discharge Orders     None          Patt Alm Macho, MD 05/05/24 2153

## 2024-05-05 NOTE — Discharge Instructions (Signed)
 As we discussed you have colitis.  I recommend you take Cipro  and Flagyl  as prescribed  I have prescribed Bentyl  for cramps  I have also prescribed Zofran  for nausea.  Please stay hydrated  As we discussed I recommend that you follow-up with GI for a colonoscopy to make sure you do not have any inflammatory bowel disease  Return to ER if you have severe abdominal pain or vomiting or dehydration

## 2024-05-05 NOTE — ED Triage Notes (Addendum)
 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

## 2024-05-05 NOTE — ED Notes (Signed)
 Patient transported to CT

## 2024-05-14 ENCOUNTER — Emergency Department (HOSPITAL_BASED_OUTPATIENT_CLINIC_OR_DEPARTMENT_OTHER)
Admission: EM | Admit: 2024-05-14 | Discharge: 2024-05-14 | Disposition: A | Discharge status: Home / Self Care | Attending: Emergency Medicine | Admitting: Emergency Medicine

## 2024-05-14 ENCOUNTER — Encounter (HOSPITAL_BASED_OUTPATIENT_CLINIC_OR_DEPARTMENT_OTHER): Payer: Self-pay

## 2024-05-14 ENCOUNTER — Emergency Department (HOSPITAL_BASED_OUTPATIENT_CLINIC_OR_DEPARTMENT_OTHER)

## 2024-05-14 DIAGNOSIS — R1031 Right lower quadrant pain: Secondary | ICD-10-CM | POA: Diagnosis not present

## 2024-05-14 DIAGNOSIS — R109 Unspecified abdominal pain: Secondary | ICD-10-CM | POA: Diagnosis present

## 2024-05-14 DIAGNOSIS — R000 Tachycardia, unspecified: Secondary | ICD-10-CM | POA: Diagnosis not present

## 2024-05-14 DIAGNOSIS — N281 Cyst of kidney, acquired: Secondary | ICD-10-CM | POA: Diagnosis not present

## 2024-05-14 DIAGNOSIS — N23 Unspecified renal colic: Secondary | ICD-10-CM | POA: Diagnosis not present

## 2024-05-14 DIAGNOSIS — N132 Hydronephrosis with renal and ureteral calculous obstruction: Secondary | ICD-10-CM | POA: Diagnosis not present

## 2024-05-14 DIAGNOSIS — N200 Calculus of kidney: Secondary | ICD-10-CM | POA: Diagnosis not present

## 2024-05-14 DIAGNOSIS — R42 Dizziness and giddiness: Secondary | ICD-10-CM | POA: Diagnosis not present

## 2024-05-14 LAB — COMPREHENSIVE METABOLIC PANEL WITH GFR
ALT: 20 U/L (ref 0–44)
AST: 34 U/L (ref 15–41)
Albumin: 4.7 g/dL (ref 3.5–5.0)
Alkaline Phosphatase: 84 U/L (ref 38–126)
Anion gap: 14 (ref 5–15)
BUN: 7 mg/dL (ref 6–20)
CO2: 24 mmol/L (ref 22–32)
Calcium: 9.4 mg/dL (ref 8.9–10.3)
Chloride: 98 mmol/L (ref 98–111)
Creatinine, Ser: 0.99 mg/dL (ref 0.61–1.24)
GFR, Estimated: >60 mL/min (ref >60)
Glucose, Bld: 134 mg/dL — ABNORMAL HIGH (ref 70–99)
Potassium: 3.6 mmol/L (ref 3.5–5.1)
Sodium: 136 mmol/L (ref 135–145)
Total Bilirubin: 1.8 mg/dL — ABNORMAL HIGH (ref 0.0–1.2)
Total Protein: 8.8 g/dL — ABNORMAL HIGH (ref 6.5–8.1)

## 2024-05-14 LAB — URINALYSIS, MICROSCOPIC (REFLEX)

## 2024-05-14 LAB — CBC WITH DIFFERENTIAL/PLATELET
Abs Immature Granulocytes: 0.07 K/uL (ref 0.00–0.07)
Basophils Absolute: 0.2 K/uL — ABNORMAL HIGH (ref 0.0–0.1)
Basophils Relative: 1 %
Eosinophils Absolute: 0.3 K/uL (ref 0.0–0.5)
Eosinophils Relative: 3 %
HCT: 43.9 % (ref 39.0–52.0)
Hemoglobin: 14.8 g/dL (ref 13.0–17.0)
Immature Granulocytes: 1 %
Lymphocytes Relative: 28 %
Lymphs Abs: 3.2 K/uL (ref 0.7–4.0)
MCH: 28.3 pg (ref 26.0–34.0)
MCHC: 33.7 g/dL (ref 30.0–36.0)
MCV: 83.9 fL (ref 80.0–100.0)
Monocytes Absolute: 0.8 K/uL (ref 0.1–1.0)
Monocytes Relative: 7 %
Neutro Abs: 6.8 K/uL (ref 1.7–7.7)
Neutrophils Relative %: 60 %
Platelets: 458 K/uL — ABNORMAL HIGH (ref 150–400)
RBC: 5.23 MIL/uL (ref 4.22–5.81)
RDW: 13.5 % (ref 11.5–15.5)
WBC: 11.3 K/uL — ABNORMAL HIGH (ref 4.0–10.5)
nRBC: 0 % (ref 0.0–0.2)

## 2024-05-14 LAB — URINALYSIS, ROUTINE W REFLEX MICROSCOPIC
Bilirubin Urine: NEGATIVE
Glucose, UA: NEGATIVE mg/dL
Ketones, ur: NEGATIVE mg/dL
Leukocytes,Ua: NEGATIVE
Nitrite: NEGATIVE
Protein, ur: NEGATIVE mg/dL
Specific Gravity, Urine: 1.01 (ref 1.005–1.030)
pH: 6 (ref 5.0–8.0)

## 2024-05-14 LAB — LIPASE, BLOOD: Lipase: 17 U/L (ref 11–51)

## 2024-05-14 MED ORDER — KETOROLAC TROMETHAMINE 15 MG/ML IJ SOLN
15.0000 mg | Freq: Once | INTRAMUSCULAR | Status: AC
Start: 2024-05-14 — End: 2024-05-14
  Administered 2024-05-14: 15 mg via INTRAVENOUS
  Filled 2024-05-14: qty 1

## 2024-05-14 MED ORDER — DIPHENHYDRAMINE HCL 50 MG/ML IJ SOLN
50.0000 mg | Freq: Once | INTRAMUSCULAR | Status: AC
  Administered 2024-05-14: 50 mg via INTRAVENOUS
  Filled 2024-05-14: qty 1

## 2024-05-14 MED ORDER — MORPHINE SULFATE (PF) 4 MG/ML IV SOLN
4.0000 mg | Freq: Once | INTRAVENOUS | Status: AC
  Administered 2024-05-14: 4 mg via INTRAVENOUS
  Filled 2024-05-14: qty 1

## 2024-05-14 MED ORDER — DROPERIDOL 2.5 MG/ML IJ SOLN
2.5000 mg | Freq: Once | INTRAMUSCULAR | Status: AC
  Administered 2024-05-14: 2.5 mg via INTRAVENOUS
  Filled 2024-05-14: qty 2

## 2024-05-14 MED ORDER — OXYCODONE HCL 5 MG PO TABS: 5.0000 mg | ORAL_TABLET | ORAL | 0 refills | Status: AC | PRN

## 2024-05-14 MED ORDER — ACETAMINOPHEN 325 MG PO TABS: 650.0000 mg | ORAL_TABLET | Freq: Four times a day (QID) | ORAL | 0 refills | Status: AC | PRN

## 2024-05-14 MED ORDER — SODIUM CHLORIDE 0.9 % IV BOLUS
1000.0000 mL | Freq: Once | INTRAVENOUS | Status: AC
Start: 2024-05-14 — End: 2024-05-14
  Administered 2024-05-14: 1000 mL via INTRAVENOUS

## 2024-05-14 MED ORDER — TAMSULOSIN HCL 0.4 MG PO CAPS: 0.4000 mg | ORAL_CAPSULE | Freq: Every day | ORAL | 0 refills | Status: AC

## 2024-05-14 MED ORDER — IOHEXOL 300 MG/ML  SOLN
100.0000 mL | Freq: Once | INTRAMUSCULAR | Status: AC | PRN
  Administered 2024-05-14: 100 mL via INTRAVENOUS

## 2024-05-14 MED ORDER — ONDANSETRON HCL 4 MG/2ML IJ SOLN
4.0000 mg | Freq: Once | INTRAMUSCULAR | Status: AC
  Administered 2024-05-14: 4 mg via INTRAVENOUS
  Filled 2024-05-14: qty 2

## 2024-05-14 NOTE — ED Notes (Signed)
 EDP notified of continued pain. No relief from previous pain intervention

## 2024-05-14 NOTE — ED Notes (Signed)
 Pts pain decreasing .CT able to transport to imaging.

## 2024-05-14 NOTE — Discharge Instructions (Addendum)
 It was a pleasure caring for you today in the emergency department.  You have a 5 mm kidney stone which is likely the source of your pain today.  Be sure to drink plenty of fluids over the next few days.  Take medications as prescribed.  Please call urology to arrange follow-up in the office if kidney stone does not pass  Please return to the emergency department for any worsening or worrisome symptoms.

## 2024-05-14 NOTE — ED Notes (Signed)
 Patient not able to give a urine specimen at this time Will ask again later

## 2024-05-14 NOTE — ED Notes (Signed)
 Pt returned from CT. Resting comfortable

## 2024-05-14 NOTE — ED Notes (Signed)
 Pt remains lethargic from medications. Able to arouse enough to ask for a urine specimen. Pt states he can't urinate . Stated I could catherize him. States no he will try and urinate

## 2024-05-14 NOTE — ED Provider Notes (Signed)
  EMERGENCY DEPARTMENT AT [facility] Provider Note  CSN: [id number] Arrival date & time: 05/14/24 9145  Chief Complaint(s) Flank Pain  HPI Ricky Buckley is a 43 y.o. male with past medical history as below, significant for ureterolithiasis, spinal stenosis recent diagnosis of colitis who presents to the ED with complaint of flank pain, abdominal pain  Was here on 11/20 with abdominal pain, dizziness.  Imaging was revealing for mild colitis, nonobstructing renal calculus and renal cyst.  He was started on antibiotics and discharged in stable condition.  Patient has been unable to fill his prescription.  He presents today with worsening abdominal pain over the past 24 hours.  Primarily right sided.  Sharp and stabbing.  Nausea vomiting.  No fevers.  No change with bowel or bladder function.  History of kidney stone.  No chest pain no dyspnea.  Past Medical History Past Medical History:  Diagnosis Date   Headache    Spinal stenosis of lumbar region    Ureterolithiasis    Patient Active Problem List   Diagnosis Date Noted   Wrist subluxation, left, initial encounter 05/22/2022   Closed displaced fracture of distal phalanx of right thumb 02/10/2017   Anterior shoulder dislocation, left, initial encounter 07/16/2016   Home Medication(s) Prior to Admission medications   Medication Sig Start Date End Date Taking? Authorizing Provider  acetaminophen  (TYLENOL ) 325 MG tablet Take 2 tablets (650 mg total) by mouth every 6 (six) hours as needed. 05/14/24  Yes Elnor Savant A, DO  oxyCODONE  (ROXICODONE ) 5 MG immediate release tablet Take 1 tablet (5 mg total) by mouth every 4 (four) hours as needed for severe pain (pain score 7-10). 05/14/24  Yes Elnor Savant LABOR, DO  tamsulosin  (FLOMAX ) 0.4 MG CAPS capsule Take 1 capsule (0.4 mg total) by mouth daily for 14 days. 05/14/24 05/28/24 Yes Elnor Savant A, DO  ciprofloxacin  (CIPRO ) 500 MG tablet Take 1 tablet (500 mg total) by mouth  every 12 (twelve) hours. 05/05/24   Patt Alm Macho, MD  COVID-19 At Home Antigen Test [facility] COVID-19 HOME TEST) KIT Use as directed within package instructions 12/26/20   Rhett Knee, [facility]  cyclobenzaprine  (FLEXERIL ) 10 MG tablet Take 1 tablet (10 mg total) by mouth 3 (three) times daily as needed for muscle spasms. Patient not taking: Reported on 07/15/2023 09/23/22   Williams, Megan E, NP  dicyclomine  (BENTYL ) 20 MG tablet Take 1 tablet (20 mg total) by mouth 2 (two) times daily as needed for spasms. 05/05/24   Patt Alm Macho, MD  ketorolac  (TORADOL ) 10 MG tablet Take 1 tablet (10 mg total) by mouth every 6 (six) hours as needed. Patient not taking: Reported on 07/15/2023 09/20/22   Small, Brooke L, PA  lidocaine  (LIDODERM ) 5 % Place 1 patch onto the skin daily. Remove & Discard patch within 12 hours or as directed by MD 09/20/22   Small, Brooke L, PA  methocarbamol  (ROBAXIN ) 500 MG tablet Take 1 tablet (500 mg total) by mouth 2 (two) times daily as needed for up to 14 doses for muscle spasms. Patient not taking: Reported on 07/15/2023 05/01/23   Cottie Donnice PARAS, MD  metroNIDAZOLE  (FLAGYL ) 500 MG tablet Take 1 tablet (500 mg total) by mouth 2 (two) times daily. 05/05/24   Patt Alm Macho, MD  ondansetron  (ZOFRAN -ODT) 4 MG disintegrating tablet 4mg  ODT q4 hours prn nausea/vomit 05/05/24   Patt Alm Macho, MD  Past Surgical History Past Surgical History:  Procedure Laterality Date   CYSTOSCOPY/RETROGRADE/URETEROSCOPY/STONE EXTRACTION WITH BASKET Left 07/26/2016   Procedure: CYSTOSCOPY/LEFT RETROGRADE/LEFT URETEROSCOPY/STONE EXTRACTION WITH BASKET, LEFT URETERAL STENT PLACEMENT;  Surgeon: Garnette Shack, MD;  Location: [facility];  Service: Urology;  Laterality: Left;   FACIAL COSMETIC SURGERY     lip and ears sewed back together   FINGER FRACTURE SURGERY Left     PINKY   HOLMIUM LASER APPLICATION Left 07/26/2016   Procedure: HOLMIUM LASER APPLICATION;  Surgeon: Garnette Shack, MD;  Location: [facility];  Service: Urology;  Laterality: Left;   KNEE SURGERY Bilateral    Family History History reviewed. No pertinent family history.  Social History Social History   Tobacco Use   Smoking status: Never   Smokeless tobacco: Never  Vaping Use   Vaping status: Never Used  Substance Use Topics   Alcohol use: Not Currently    Comment: 4 times a year   Drug use: No   Allergies Penicillins  Review of Systems A thorough review of systems was obtained and all systems are negative except as noted in the HPI and PMH.   Physical Exam Vital Signs  I have reviewed the triage vital signs BP (!) 165/103   Pulse (!) 48   Temp 97.7 F (36.5 C)   Resp 17   SpO2 99%  Physical Exam Vitals and nursing note reviewed.  Constitutional:      General: He is not in acute distress.    Appearance: Normal appearance. He is well-developed. He is not ill-appearing.  HENT:     Head: Normocephalic and atraumatic.     Right Ear: External ear normal.     Left Ear: External ear normal.     Nose: Nose normal.     Mouth/Throat:     Mouth: Mucous membranes are moist.  Eyes:     General: No scleral icterus.       Right eye: No discharge.        Left eye: No discharge.  Cardiovascular:     Rate and Rhythm: Tachycardia present.  Pulmonary:     Effort: Pulmonary effort is normal. No respiratory distress.     Breath sounds: No stridor.  Abdominal:     General: Abdomen is flat. There is no distension.     Palpations: Abdomen is soft.     Tenderness: There is abdominal tenderness. There is no guarding.   Musculoskeletal:        General: No deformity.       Arms:     Cervical back: No rigidity.  Skin:    General: Skin is warm and dry.     Coloration: Skin is not cyanotic, jaundiced or pale.  Neurological:     Mental Status: He is alert.  Psychiatric:         Speech: Speech normal.        Behavior: Behavior normal. Behavior is cooperative.     ED Results and Treatments Labs (all labs ordered are listed, but only abnormal results are displayed) Labs Reviewed  CBC WITH DIFFERENTIAL/PLATELET - Abnormal; Notable for the following components:      Result Value   WBC 11.3 (*)    Platelets 458 (*)    Basophils Absolute 0.2 (*)    All other components within normal limits  COMPREHENSIVE METABOLIC PANEL WITH GFR - Abnormal; Notable for the following components:   Glucose, Bld 134 (*)    Total Protein 8.8 (*)    Total  Bilirubin 1.8 (*)    All other components within normal limits  URINALYSIS, ROUTINE W REFLEX MICROSCOPIC - Abnormal; Notable for the following components:   Hgb urine dipstick MODERATE (*)    All other components within normal limits  URINALYSIS, MICROSCOPIC (REFLEX) - Abnormal; Notable for the following components:   Bacteria, UA RARE (*)    All other components within normal limits  LIPASE, BLOOD                                                                                                                          Radiology CT ABDOMEN PELVIS W CONTRAST Result Date: 05/14/2024 EXAM: CT ABDOMEN AND PELVIS WITH CONTRAST 05/14/2024 10:54:00 AM TECHNIQUE: CT of the abdomen and pelvis was performed with the administration of intravenous contrast. Multiplanar reformatted images are provided for review. Automated exposure control, iterative reconstruction, and/or weight-based adjustment of the mA/kV was utilized to reduce the radiation dose to as low as reasonably achievable. COMPARISON: CT Abdomen And Pelvis dated 05/05/2024. CLINICAL HISTORY: RLQ abdominal pain; right flank/rlq abd pain. FINDINGS: LOWER CHEST: No acute abnormality. LIVER: The liver is unremarkable. GALLBLADDER AND BILE DUCTS: Gallbladder is unremarkable. No biliary ductal dilatation. SPLEEN: No acute abnormality. PANCREAS: No acute abnormality. ADRENAL GLANDS: No  acute abnormality. KIDNEYS, URETERS AND BLADDER: Multiple small, less than 1 cm bilateral renal calculi are again seen. There is mild right hydronephrosis due to a 5 mm calculus at the right UPJ. A small right renal cyst is stable. No perinephric or periureteral stranding. Urinary bladder is unremarkable. GI AND BOWEL: Stomach demonstrates no acute abnormality. There is no bowel obstruction. The appendix is normal. Resolution of rectosigmoid wall thickening is seen since the prior exam. PERITONEUM AND RETROPERITONEUM: No ascites. No free air. VASCULATURE: Aorta is normal in caliber. LYMPH NODES: No lymphadenopathy. REPRODUCTIVE ORGANS: No acute abnormality. BONES AND SOFT TISSUES: No acute osseous abnormality. No focal soft tissue abnormality. CONTRAST: 100 Cc Of Omnipaque  300 (Iohexol ) IV IMPRESSION: 1. Mild right hydronephrosis due to a 5 mm calculus at the right UPJ. 2. Multiple small bilateral renal calculi measuring less than 1 cm. 3. Resolution of rectosigmoid wall thickening since the prior exam. Electronically signed by: Norleen Kil MD 05/14/2024 11:31 AM EST RP Workstation: [id number]    Pertinent labs & imaging results that were available during my care of the patient were reviewed by me and considered in my medical decision making (see MDM for details).  Medications Ordered in ED Medications  ketorolac  (TORADOL ) 15 MG/ML injection 15 mg (15 mg Intravenous Given 05/14/24 0922)  morphine  (PF) 4 MG/ML injection 4 mg (4 mg Intravenous Given 05/14/24 0921)  ondansetron  (ZOFRAN ) injection 4 mg (4 mg Intravenous Given 05/14/24 0920)  sodium chloride  0.9 % bolus 1,000 mL (0 mLs Intravenous Stopped 05/14/24 1040)  iohexol  (OMNIPAQUE ) 300 MG/ML solution 100 mL (100 mLs Intravenous Contrast Given 05/14/24 1047)  droperidol (INAPSINE) 2.5 MG/ML injection 2.5 mg (2.5 mg Intravenous Given 05/14/24 1036)  diphenhydrAMINE  (BENADRYL )  injection 50 mg (50 mg Intravenous Given 05/14/24 1038)                                                                                                                                      Procedures Procedures  (including critical care time)  Medical Decision Making / ED Course    Medical Decision Making:    Ricky Buckley is a 43 y.o. male with past medical history as below, significant for ureterolithiasis, spinal stenosis recent diagnosis of colitis who presents to the ED with complaint of flank pain, abdominal pain. The complaint involves an extensive differential diagnosis and also carries with it a high risk of complications and morbidity.  Serious etiology was considered. Ddx includes but is not limited to: Differential diagnosis includes but is not exclusive to acute appendicitis, renal colic, testicular torsion, urinary tract infection, prostatitis,  diverticulitis, small bowel obstruction, colitis, abdominal aortic aneurysm, gastroenteritis, constipation etc.   Complete initial physical exam performed, notably the patient was in no acute distress but does appear uncomfortable.    Reviewed and confirmed nursing documentation for past medical history, family history, social history.  Vital signs reviewed.    Right sided abdominal pain, nausea and vomiting Nephrolithiasis > - Right flank and lower quadrant pain over the last 24 hours.  Abdomen is soft.  He is HDS. - Labs stable - CT abdomen pelvis with 5 mm stone with mild hydro, rectosigmoid wall thickening that has since improved from prior imaging - Provided Toradol , morphine , Zofran  and fluids; droperidol - Symptoms have greatly improved, recheck he is feeling better, he is tolerating p.o. without difficulty, no vomiting.  Discussed findings from today, discussed treatment for nephrolithiasis.  Encourage outpatient follow-up with urology if symptoms persist.   Clinical Course as of 05/14/24 1351  Sat May 14, 2024  1150 Mild right hydronephrosis with 5 mm stone at UPJ [SG]  1226 Symptoms have improved  significantly Tolerating p.o.  [SG]    Clinical Course User Index [SG] Elnor Jayson LABOR, DO     1:51 PM:  I have discussed the diagnosis/risks/treatment options with the patient and family.  Evaluation and diagnostic testing in the emergency department does not suggest an emergent condition requiring admission or immediate intervention beyond what has been performed at this time.  They will follow up with pcp/urology. We also discussed returning to the ED immediately if new or worsening sx occur. We discussed the sx which are most concerning (e.g., sudden worsening pain, fever, inability to tolerate by mouth) that necessitate immediate return.    The patient appears reasonably screened and/or stabilized for discharge and I doubt any other medical condition or other [facility] requiring further screening, evaluation, or treatment in the ED at this time prior to discharge.                 Additional history obtained: -Additional history obtained from family -External records from  outside source obtained and reviewed including: Chart review including previous notes, labs, imaging, consultation notes including  PDMP, prior ER visits, prior imaging   Lab Tests: -I ordered, reviewed, and interpreted labs.   The pertinent results include:   Labs Reviewed  CBC WITH DIFFERENTIAL/PLATELET - Abnormal; Notable for the following components:      Result Value   WBC 11.3 (*)    Platelets 458 (*)    Basophils Absolute 0.2 (*)    All other components within normal limits  COMPREHENSIVE METABOLIC PANEL WITH GFR - Abnormal; Notable for the following components:   Glucose, Bld 134 (*)    Total Protein 8.8 (*)    Total Bilirubin 1.8 (*)    All other components within normal limits  URINALYSIS, ROUTINE W REFLEX MICROSCOPIC - Abnormal; Notable for the following components:   Hgb urine dipstick MODERATE (*)    All other components within normal limits  URINALYSIS, MICROSCOPIC (REFLEX) - Abnormal;  Notable for the following components:   Bacteria, UA RARE (*)    All other components within normal limits  LIPASE, BLOOD    Notable for he has slight leukocytosis, likely secondary to vomiting, demargination.  Urinalysis is not consistent with infection.  Blood noted in urine likely secondary to nephrolithiasis  EKG   EKG Interpretation Date/Time:    Ventricular Rate:    PR Interval:    QRS Duration:    QT Interval:    QTC Calculation:   R Axis:      Text Interpretation:           Imaging Studies ordered: I ordered imaging studies including CTAP I independently visualized the following imaging with scope of interpretation limited to determining acute life threatening conditions related to emergency care; findings noted above I agree with the radiologist interpretation If any imaging was obtained with contrast I closely monitored patient for any possible adverse reaction a/w contrast administration in the emergency department   Medicines ordered and prescription drug management: Meds ordered this encounter  Medications   ketorolac  (TORADOL ) 15 MG/ML injection 15 mg   morphine  (PF) 4 MG/ML injection 4 mg   ondansetron  (ZOFRAN ) injection 4 mg   sodium chloride  0.9 % bolus 1,000 mL   iohexol  (OMNIPAQUE ) 300 MG/ML solution 100 mL   droperidol  (INAPSINE ) 2.5 MG/ML injection 2.5 mg   diphenhydrAMINE  (BENADRYL ) injection 50 mg   tamsulosin  (FLOMAX ) 0.4 MG CAPS capsule    Sig: Take 1 capsule (0.4 mg total) by mouth daily for 14 days.    Dispense:  14 capsule    Refill:  0   oxyCODONE  (ROXICODONE ) 5 MG immediate release tablet    Sig: Take 1 tablet (5 mg total) by mouth every 4 (four) hours as needed for severe pain (pain score 7-10).    Dispense:  10 tablet    Refill:  0   acetaminophen  (TYLENOL ) 325 MG tablet    Sig: Take 2 tablets (650 mg total) by mouth every 6 (six) hours as needed.    Dispense:  36 tablet    Refill:  0    -I have reviewed the patients home medicines  and have made adjustments as needed   Consultations Obtained: Not applicable  Cardiac Monitoring: The patient was maintained on a cardiac monitor.  I personally viewed and interpreted the cardiac monitored which showed an underlying rhythm of: NSR, transient sinus brady Continuous pulse oximetry interpreted by myself, 99% on RA.    Social Determinants of Health:  Diagnosis or  treatment significantly limited by social determinants of health: obesity   Reevaluation: After the interventions noted above, I reevaluated the patient and found that they have improved  Co morbidities that complicate the patient evaluation  Past Medical History:  Diagnosis Date   Headache    Spinal stenosis of lumbar region    Ureterolithiasis       Dispostion: Disposition decision including need for hospitalization was considered, and patient discharged from emergency department.    Final Clinical Impression(s) / ED Diagnoses Final diagnoses:  Nephrolithiasis  Ureteral colic        Elnor Jayson LABOR, DO 05/14/24 1351

## 2024-05-14 NOTE — ED Triage Notes (Addendum)
 Pt moaning and groaning in pain. Right flank pain began 2 hours ago and increasingly worsening. Dry heaves Pt wasn't able to get antibiotics when last seen for colitis. No diarrhea

## 2024-05-14 NOTE — ED Notes (Signed)
 Pt assisted to BR. Urine sample obtained and sent to lab

## 2024-05-14 NOTE — ED Notes (Signed)
 Patient not able to urinate at this time Will ask again  later
